# Patient Record
Sex: Male | Born: 1980 | Race: White | Hispanic: No | Marital: Married | Smoking: Never smoker
Health system: Southern US, Community
[De-identification: ages and names within clinical notes are randomized; demographics above are authoritative.]

## PROBLEM LIST (undated history)

## (undated) DIAGNOSIS — M549 Dorsalgia, unspecified: Secondary | ICD-10-CM

## (undated) DIAGNOSIS — Z8619 Personal history of other infectious and parasitic diseases: Secondary | ICD-10-CM

## (undated) DIAGNOSIS — F4310 Post-traumatic stress disorder, unspecified: Secondary | ICD-10-CM

## (undated) DIAGNOSIS — R200 Anesthesia of skin: Secondary | ICD-10-CM

## (undated) DIAGNOSIS — F419 Anxiety disorder, unspecified: Secondary | ICD-10-CM

## (undated) DIAGNOSIS — G8929 Other chronic pain: Secondary | ICD-10-CM

## (undated) DIAGNOSIS — K219 Gastro-esophageal reflux disease without esophagitis: Secondary | ICD-10-CM

## (undated) DIAGNOSIS — R202 Paresthesia of skin: Secondary | ICD-10-CM

## (undated) HISTORY — PX: NO PAST SURGERIES: SHX2092

---

## 2007-10-03 ENCOUNTER — Emergency Department (HOSPITAL_COMMUNITY): Admission: EM | Admit: 2007-10-03 | Discharge: 2007-10-03 | Payer: Self-pay | Admitting: Emergency Medicine

## 2010-01-07 ENCOUNTER — Emergency Department (HOSPITAL_COMMUNITY): Admission: EM | Admit: 2010-01-07 | Discharge: 2010-01-07 | Payer: Self-pay | Admitting: Emergency Medicine

## 2011-04-06 ENCOUNTER — Encounter: Payer: Self-pay | Admitting: Emergency Medicine

## 2011-04-06 ENCOUNTER — Emergency Department (HOSPITAL_COMMUNITY)
Admission: EM | Admit: 2011-04-06 | Discharge: 2011-04-07 | Disposition: A | Discharge status: Home / Self Care | Payer: 59 | Attending: Emergency Medicine | Admitting: Emergency Medicine

## 2011-04-06 DIAGNOSIS — R197 Diarrhea, unspecified: Secondary | ICD-10-CM | POA: Insufficient documentation

## 2011-04-06 DIAGNOSIS — R1110 Vomiting, unspecified: Secondary | ICD-10-CM

## 2011-04-06 DIAGNOSIS — R109 Unspecified abdominal pain: Secondary | ICD-10-CM | POA: Insufficient documentation

## 2011-04-06 DIAGNOSIS — R112 Nausea with vomiting, unspecified: Secondary | ICD-10-CM | POA: Insufficient documentation

## 2011-04-06 MED ORDER — SODIUM CHLORIDE 0.9 % IV BOLUS (SEPSIS)
1000.0000 mL | Freq: Once | INTRAVENOUS | Status: AC
  Administered 2011-04-06: 1000 mL via INTRAVENOUS

## 2011-04-06 MED ORDER — ONDANSETRON HCL 4 MG/2ML IJ SOLN
4.0000 mg | Freq: Once | INTRAMUSCULAR | Status: AC
  Administered 2011-04-06: 4 mg via INTRAVENOUS
  Filled 2011-04-06: qty 2

## 2011-04-06 NOTE — ED Notes (Signed)
C/o n/v/d since 5am. Also some generalized abd pain. States passing out in bathroom at urgent care in [place] then told to come to ED. Pt a&ox3, neuro intact. Denies hitting head.

## 2011-04-06 NOTE — ED Notes (Signed)
Pt also st's he had syncopal episode earlier today

## 2011-04-06 NOTE — ED Provider Notes (Signed)
History     CSN: [id number] Arrival date & time: 04/06/2011 10:09 PM   First MD Initiated Contact with Patient 04/06/11 2242      No chief complaint on file.   (Consider location/radiation/quality/duration/timing/severity/associated sxs/prior treatment) The history is provided by the patient.   Patient states that he woke up this morning with complaints of nausea, vomiting, diarrhea. He does have some abdominal pain but it is only right before retching. Assessment the patient has vomited he no longer feels any pain. He states that he is vomited  approximately 13-14 times day and states that he has diarrhea when his vomiting. He believes he is dehydrated, and just feels bad, and thinks he needs to be rehydrated. Other than that patient is healthy with no significant past medical history. He denies chest pain, shortness of breath, chills, fever. History reviewed. No pertinent past medical history.  History reviewed. No pertinent past surgical history.  No family history on file.  History  Substance Use Topics  . Smoking status: Never Smoker   . Smokeless tobacco: Never Used  . Alcohol Use: No      Review of Systems  All other systems reviewed and are negative.    Allergies  Review of patient's allergies indicates no known allergies.  Home Medications   Current Outpatient Rx  Name Route Sig Dispense Refill  . AMPHETAMINE-DEXTROAMPHETAMINE 30 MG PO CP24 Oral Take 30 mg by mouth every morning.        BP 114/65  Pulse 100  Temp(Src) 98.8 F (37.1 C) (Oral)  Resp 19  SpO2 100%  Physical Exam  Nursing note and vitals reviewed. Constitutional: He is oriented to person, place, and time. He appears well-developed and well-nourished.  HENT:  Head: Normocephalic and atraumatic.  Eyes: EOM are normal. Pupils are equal, round, and reactive to light.  Neck: Normal range of motion.  Cardiovascular: Normal rate and regular rhythm.   Pulmonary/Chest: Effort normal and breath  sounds normal.  Abdominal: Soft. Bowel sounds are normal.  Musculoskeletal: Normal range of motion.  Neurological: He is alert and oriented to person, place, and time.  Skin: Skin is warm and dry.    ED Course  Procedures (including critical care time)  Labs Reviewed - No data to display No results found.   No diagnosis found.    MDM  PT receiving 2L bolus of fluids and some Zofran. Currently not experiencing any pains.        Dorthula Matas, PA 04/06/11 2336

## 2011-04-06 NOTE — ED Notes (Signed)
Pt c/o nausea, vomiting and diarrhea, onset this am.  Has vomited approx 13-14 times since this am.  Unable to keep any liquids down

## 2011-04-07 LAB — POCT I-STAT, CHEM 8
BUN: 16 mg/dL (ref 6–23)
Calcium, Ion: 1.1 mmol/L — ABNORMAL LOW (ref 1.12–1.32)
Chloride: 104 mEq/L (ref 96–112)
Hemoglobin: 14.6 g/dL (ref 13.0–17.0)
Potassium: 3.7 mEq/L (ref 3.5–5.1)

## 2011-04-07 MED ORDER — PROMETHAZINE HCL 25 MG PO TABS: 25.0000 mg | ORAL_TABLET | Freq: Four times a day (QID) | ORAL | Status: AC | PRN

## 2011-04-07 MED ORDER — ONDANSETRON HCL 4 MG/2ML IJ SOLN
4.0000 mg | Freq: Once | INTRAMUSCULAR | Status: AC
  Administered 2011-04-07: 4 mg via INTRAVENOUS
  Filled 2011-04-07: qty 2

## 2011-04-07 NOTE — ED Provider Notes (Signed)
Medical screening examination/treatment/procedure(s) were performed by non-physician practitioner and as supervising physician I was immediately available for consultation/collaboration.   Lorien Shingler L Danaija Eskridge, MD 04/07/11 1935 

## 2011-04-07 NOTE — ED Notes (Signed)
Patient tolerating fluids. No complaints of nausea at this time

## 2011-04-07 NOTE — ED Provider Notes (Addendum)
History     CSN: [id number] Arrival date & time: 04/06/2011 10:09 PM   First MD Initiated Contact with Patient 04/06/11 2242      No chief complaint on file.   (Consider location/radiation/quality/duration/timing/severity/associated sxs/prior treatment) The history is provided by the patient.    History reviewed. No pertinent past medical history.  History reviewed. No pertinent past surgical history.  No family history on file.  History  Substance Use Topics  . Smoking status: Never Smoker   . Smokeless tobacco: Never Used  . Alcohol Use: No      Review of Systems  Constitutional: Negative.   HENT: Negative.   Respiratory: Negative.   Cardiovascular: Negative.   Gastrointestinal: Positive for nausea, vomiting and diarrhea.  Genitourinary: Negative for dysuria.  Musculoskeletal: Negative.   Neurological: Negative.   Hematological: Negative.   Psychiatric/Behavioral: Negative.     Allergies  Review of patient's allergies indicates no known allergies.  Home Medications   Current Outpatient Rx  Name Route Sig Dispense Refill  . AMPHETAMINE-DEXTROAMPHETAMINE 30 MG PO CP24 Oral Take 30 mg by mouth every morning.      Marland Kitchen PROMETHAZINE HCL 25 MG PO TABS Oral Take 1 tablet (25 mg total) by mouth every 6 (six) hours as needed for nausea. 20 tablet 0  . PROMETHAZINE HCL 25 MG PO TABS Oral Take 1 tablet (25 mg total) by mouth every 6 (six) hours as needed for nausea. 20 tablet 0    BP 92/51  Pulse 60  Temp(Src) 98.4 F (36.9 C) (Oral)  Resp 14  SpO2 98%  Physical Exam  Constitutional: He is oriented to person, place, and time. He appears well-developed and well-nourished.  Neck: Neck supple.  Cardiovascular:       hypotensive  Pulmonary/Chest: Breath sounds normal.  Abdominal: Soft.  Musculoskeletal: Normal range of motion.  Neurological: He is oriented to person, place, and time.  Skin: Skin is warm.  Psychiatric: He has a normal mood and affect.    ED  Course  Procedures (including critical care time)  Labs Reviewed  POCT I-STAT, CHEM 8 - Abnormal; Notable for the following:    Glucose, Bld 109 (*)    Calcium, Ion 1.10 (*)    All other components within normal limits  I-STAT, CHEM 8   No results found.   1. Vomiting and diarrhea    3:22 AM feels better able to tolerate PO  After IV fluids and tolerating PIO discharged   MDM  Viral vomiting        Arman Filter, NP 04/07/11 0322  Arman Filter, NP 04/07/11 8295  Arman Filter, NP 04/11/11 0518  Arman Filter, NP 04/11/11 6213  Arman Filter, NP 04/29/11 [phone]

## 2011-04-11 NOTE — ED Provider Notes (Signed)
Medical screening examination/treatment/procedure(s) were performed by non-physician practitioner and as supervising physician I was immediately available for consultation/collaboration.   Hershell Brandl D Lequita Meadowcroft, MD 04/11/11 0544 

## 2011-04-29 NOTE — ED Provider Notes (Signed)
Medical screening examination/treatment/procedure(s) were performed by non-physician practitioner and as supervising physician I was immediately available for consultation/collaboration.   Vida Roller, MD 04/29/11 [phone]

## 2011-10-28 ENCOUNTER — Encounter (HOSPITAL_COMMUNITY): Payer: Self-pay | Admitting: Emergency Medicine

## 2011-10-28 ENCOUNTER — Emergency Department (HOSPITAL_COMMUNITY): Payer: Worker's Compensation

## 2011-10-28 ENCOUNTER — Emergency Department (HOSPITAL_COMMUNITY)
Admission: EM | Admit: 2011-10-28 | Discharge: 2011-10-28 | Disposition: A | Discharge status: Home / Self Care | Payer: Worker's Compensation | Attending: Emergency Medicine | Admitting: Emergency Medicine

## 2011-10-28 DIAGNOSIS — M7580 Other shoulder lesions, unspecified shoulder: Secondary | ICD-10-CM

## 2011-10-28 DIAGNOSIS — Y990 Civilian activity done for income or pay: Secondary | ICD-10-CM | POA: Insufficient documentation

## 2011-10-28 DIAGNOSIS — M67919 Unspecified disorder of synovium and tendon, unspecified shoulder: Secondary | ICD-10-CM | POA: Insufficient documentation

## 2011-10-28 DIAGNOSIS — R296 Repeated falls: Secondary | ICD-10-CM | POA: Insufficient documentation

## 2011-10-28 DIAGNOSIS — M719 Bursopathy, unspecified: Secondary | ICD-10-CM | POA: Insufficient documentation

## 2011-10-28 DIAGNOSIS — R209 Unspecified disturbances of skin sensation: Secondary | ICD-10-CM | POA: Insufficient documentation

## 2011-10-28 DIAGNOSIS — M25519 Pain in unspecified shoulder: Secondary | ICD-10-CM | POA: Insufficient documentation

## 2011-10-28 MED ORDER — HYDROCODONE-ACETAMINOPHEN 5-325 MG PO TABS: 1.0000 | ORAL_TABLET | ORAL | Status: AC | PRN

## 2011-10-28 NOTE — Discharge Instructions (Signed)
Rotator Cuff Tendonitis  Please follow up with Dr. Victorino Dike, call his office tomorrow for an appointment.  Do not use the arm until you can be seen by him.  Use the sling for comfort, ice and take the pain medication as directed.  Do not drive while you are taking the pain medication as it will make you sleepy.   The rotator cuff is the collection of all the muscles and tendons (the supraspinatus, infraspinatus, subscapularis, and teres minor muscles and their tendons) that help your shoulder stay in place. This unit holds the head of the upper arm bone (humerus) in the cup (fossa) of the shoulder blade (scapula). Basically, it connects the arm to the shoulder. Tendinitis is a swelling and irritation of the tissue, called cord like structures (tendons) that connect muscle to bone. It usually is caused by overusing the joint involved. When the tissue surrounding a tendon (the synovium) becomes inflamed, it is called tenosynovitis. This also is often the result of overuse in people whose jobs require repetitive (over and over again) types of motion. HOME CARE INSTRUCTIONS   Use a sling or splint for as long as directed by your caregiver until the pain decreases.   Apply ice to the injury for 15 to 20 minutes, 3 to 4 times per day. Put the ice in a plastic bag and place a towel between the bag of ice and your skin.   Try to avoid use other than gentle range of motion while your shoulder is painful. Use and exercise only as directed by your caregiver. Stop exercises or range of motion if pain or discomfort increases, unless directed otherwise by your caregiver.   Only take over-the-counter or prescription medicines for pain, discomfort, or fever as directed by your caregiver.   If you were give a shoulder sling and straps (immobilizer), do not remove it except as directed, or until you see a caregiver for a follow-up examination. If you need to remove it, move your arm as little as possible or as directed.     You may want to sleep on several pillows at night to lessen swelling and pain.  SEEK IMMEDIATE MEDICAL CARE IF:   Pain in your shoulder increases or new pain develops in your arm, hand, or fingers and is not relieved with medications.   You develop new, unexplained symptoms, especially increased numbness in the hands or loss of strength, or you develop any worsening of the problems which brought you in for care.   Your arm, hand, or fingers are numb or tingling.   Your arm, hand, or fingers are swollen, painful, or turn white or blue.  Document Released: 07/25/2003 Document Revised: 04/23/2011 Document Reviewed: 03/01/2008 [facility] Patient Information 2012 [place], [place].
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
## 2011-10-28 NOTE — Progress Notes (Signed)
Orthopedic Tech Progress Note Patient Details:  Shawn Mcdonald 04/22/1981 [id number]  Ortho Devices Type of Ortho Device: Arm foam sling Ortho Device/Splint Location: (L) UE Ortho Device/Splint Interventions: Application   Jennye Moccasin 10/28/2011, 10:01 PM

## 2011-10-31 NOTE — ED Provider Notes (Signed)
Medical screening examination/treatment/procedure(s) were performed by non-physician practitioner and as supervising physician I was immediately available for consultation/collaboration.    Nelia Shi, MD 10/31/11 [phone]

## 2012-04-12 ENCOUNTER — Encounter (HOSPITAL_COMMUNITY): Payer: Self-pay | Admitting: Emergency Medicine

## 2012-04-12 ENCOUNTER — Emergency Department (HOSPITAL_COMMUNITY): Payer: Worker's Compensation

## 2012-04-12 ENCOUNTER — Emergency Department (HOSPITAL_COMMUNITY)
Admission: EM | Admit: 2012-04-12 | Discharge: 2012-04-12 | Disposition: A | Discharge status: Home / Self Care | Payer: Worker's Compensation | Attending: Emergency Medicine | Admitting: Emergency Medicine

## 2012-04-12 DIAGNOSIS — IMO0002 Reserved for concepts with insufficient information to code with codable children: Secondary | ICD-10-CM | POA: Insufficient documentation

## 2012-04-12 DIAGNOSIS — Y9389 Activity, other specified: Secondary | ICD-10-CM | POA: Insufficient documentation

## 2012-04-12 DIAGNOSIS — M549 Dorsalgia, unspecified: Secondary | ICD-10-CM | POA: Insufficient documentation

## 2012-04-12 DIAGNOSIS — Y9289 Other specified places as the place of occurrence of the external cause: Secondary | ICD-10-CM | POA: Insufficient documentation

## 2012-04-12 DIAGNOSIS — M545 Low back pain: Secondary | ICD-10-CM

## 2012-04-12 MED ORDER — HYDROCODONE-ACETAMINOPHEN 5-325 MG PO TABS: 1.0000 | ORAL_TABLET | Freq: Four times a day (QID) | ORAL | Status: DC | PRN

## 2012-04-12 MED ORDER — CYCLOBENZAPRINE HCL 10 MG PO TABS: 5.0000 mg | ORAL_TABLET | Freq: Three times a day (TID) | ORAL | Status: DC

## 2012-04-12 MED ORDER — IBUPROFEN 400 MG PO TABS
800.0000 mg | ORAL_TABLET | Freq: Once | ORAL | Status: AC
  Administered 2012-04-12: 800 mg via ORAL
  Filled 2012-04-12: qty 2

## 2012-04-12 MED ORDER — IBUPROFEN 800 MG PO TABS: 800.0000 mg | ORAL_TABLET | Freq: Three times a day (TID) | ORAL | Status: DC | PRN

## 2012-04-12 NOTE — ED Provider Notes (Signed)
Medical screening examination/treatment/procedure(s) were performed by non-physician practitioner and as supervising physician I was immediately available for consultation/collaboration.   Richardean Canal, MD 04/12/12 [phone]
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
## 2012-04-12 NOTE — ED Notes (Signed)
Pt c/o 3/10 sharp pain to lower back radiating to right thigh. Pt A&Ox4, ambulatory, nad.

## 2012-10-17 ENCOUNTER — Encounter (HOSPITAL_COMMUNITY): Payer: Self-pay | Admitting: Emergency Medicine

## 2012-10-17 ENCOUNTER — Emergency Department (HOSPITAL_COMMUNITY)
Admission: EM | Admit: 2012-10-17 | Discharge: 2012-10-18 | Disposition: A | Discharge status: Home / Self Care | Payer: Worker's Compensation | Attending: Emergency Medicine | Admitting: Emergency Medicine

## 2012-10-17 ENCOUNTER — Emergency Department (HOSPITAL_COMMUNITY): Payer: Worker's Compensation

## 2012-10-17 DIAGNOSIS — S43402A Unspecified sprain of left shoulder joint, initial encounter: Secondary | ICD-10-CM

## 2012-10-17 DIAGNOSIS — IMO0002 Reserved for concepts with insufficient information to code with codable children: Secondary | ICD-10-CM | POA: Insufficient documentation

## 2012-10-17 DIAGNOSIS — Y9389 Activity, other specified: Secondary | ICD-10-CM | POA: Insufficient documentation

## 2012-10-17 DIAGNOSIS — Z79899 Other long term (current) drug therapy: Secondary | ICD-10-CM | POA: Insufficient documentation

## 2012-10-17 DIAGNOSIS — Y9289 Other specified places as the place of occurrence of the external cause: Secondary | ICD-10-CM | POA: Insufficient documentation
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
## 2012-10-17 NOTE — ED Notes (Signed)
[id number]<AV> Expected date:<BR> Expected time:<BR> Means of arrival:<BR> Comments:<BR> Hold for Nand

## 2012-10-18 MED ORDER — MELOXICAM 15 MG PO TABS: 15.0000 mg | ORAL_TABLET | Freq: Every day | ORAL | Status: DC

## 2012-10-18 NOTE — ED Provider Notes (Signed)
Medical screening examination/treatment/procedure(s) were performed by non-physician practitioner and as supervising physician I was immediately available for consultation/collaboration.  Tionna Gigante L Hilary Pundt, MD 10/18/12 1530 

## 2014-08-02 ENCOUNTER — Other Ambulatory Visit: Payer: Self-pay | Admitting: Neurosurgery

## 2014-08-06 NOTE — Pre-Procedure Instructions (Signed)
Shawn Mcdonald  08/06/2014   Your procedure is scheduled on: Tues, Mar 29 @ 12:00 PM  Report to [facility]  at 9:00 AM.  Call this number if you have problems the morning of surgery: [phone]   Remember:   Do not eat food or drink liquids after midnight.   Take these medicines the morning of surgery with A SIP OF WATER: Adderall(Amphetamine-Dextoamphetamine) and Prozac(Fluoxetine)             Stop taking your Mobic. No Goody's,BC's,Aleve,Aspirin,Ibuprofen,Fish Oil,or any Herbal Medications.    Do not wear jewelry.  Do not wear lotions, powders, or colognes. You may wear deodorant.  Men may shave face and neck.  Do not bring valuables to the hospital.  [facility] is not responsible                  for any belongings or valuables.               Contacts, dentures or bridgework may not be worn into surgery.  Leave suitcase in the car. After surgery it may be brought to your room.  For patients admitted to the hospital, discharge time is determined by your                treatment team.                 Special Instructions:  [place] - Preparing for Surgery  Before surgery, you can play an important role.  Because skin is not sterile, your skin needs to be as free of germs as possible.  You can reduce the number of germs on you skin by washing with CHG (chlorahexidine gluconate) soap before surgery.  CHG is an antiseptic cleaner which kills germs and bonds with the skin to continue killing germs even after washing.  Please DO NOT use if you have an allergy to CHG or antibacterial soaps.  If your skin becomes reddened/irritated stop using the CHG and inform your nurse when you arrive at Short Stay.  Do not shave (including legs and underarms) for at least 48 hours prior to the first CHG shower.  You may shave your face.  Please follow these instructions carefully:   1.  Shower with CHG Soap the night before surgery and the                                morning of  Surgery.  2.  If you choose to wash your hair, wash your hair first as usual with your       normal shampoo.  3.  After you shampoo, rinse your hair and body thoroughly to remove the                      Shampoo.  4.  Use CHG as you would any other liquid soap.  You can apply chg directly       to the skin and wash gently with scrungie or a clean washcloth.  5.  Apply the CHG Soap to your body ONLY FROM THE NECK DOWN.        Do not use on open wounds or open sores.  Avoid contact with your eyes,       ears, mouth and genitals (private parts).  Wash genitals (private parts)       with your normal soap.  6.  Wash thoroughly,  paying special attention to the area where your surgery        will be performed.  7.  Thoroughly rinse your body with warm water from the neck down.  8.  DO NOT shower/wash with your normal soap after using and rinsing off       the CHG Soap.  9.  Pat yourself dry with a clean towel.            10.  Wear clean pajamas.            11.  Place clean sheets on your bed the night of your first shower and do not        sleep with pets.  Day of Surgery  Do not apply any lotions/deoderants the morning of surgery.  Please wear clean clothes to the hospital/surgery center.     Please read over the following fact sheets that you were given: Pain Booklet, Coughing and Deep Breathing, Blood Transfusion Information, MRSA Information and Surgical Site Infection Prevention

## 2014-08-07 ENCOUNTER — Encounter (HOSPITAL_COMMUNITY)
Admission: RE | Admit: 2014-08-07 | Discharge: 2014-08-07 | Disposition: A | Discharge status: Home / Self Care | Payer: 59 | Source: Ambulatory Visit | Attending: Neurosurgery | Admitting: Neurosurgery

## 2014-08-07 ENCOUNTER — Encounter (HOSPITAL_COMMUNITY): Payer: Self-pay

## 2014-08-07 DIAGNOSIS — M4316 Spondylolisthesis, lumbar region: Secondary | ICD-10-CM | POA: Insufficient documentation

## 2014-08-07 DIAGNOSIS — Z01812 Encounter for preprocedural laboratory examination: Secondary | ICD-10-CM | POA: Insufficient documentation

## 2014-08-07 HISTORY — DX: Anesthesia of skin: R20.0

## 2014-08-07 HISTORY — DX: Dorsalgia, unspecified: M54.9

## 2014-08-07 HISTORY — DX: Paresthesia of skin: R20.2

## 2014-08-07 HISTORY — DX: Post-traumatic stress disorder, unspecified: F43.10

## 2014-08-07 HISTORY — DX: Personal history of other infectious and parasitic diseases: Z86.19

## 2014-08-07 HISTORY — DX: Anxiety disorder, unspecified: F41.9

## 2014-08-07 HISTORY — DX: Other chronic pain: G89.29

## 2014-08-07 HISTORY — DX: Gastro-esophageal reflux disease without esophagitis: K21.9

## 2014-08-07 LAB — BASIC METABOLIC PANEL
ANION GAP: 8 (ref 5–15)
BUN: 10 mg/dL (ref 6–23)
CALCIUM: 9.5 mg/dL (ref 8.4–10.5)
CO2: 29 mmol/L (ref 19–32)
CREATININE: 0.87 mg/dL (ref 0.50–1.35)
Chloride: 103 mmol/L (ref 96–112)
GFR calc non Af Amer: >90 mL/min (ref >90)
Glucose, Bld: 91 mg/dL (ref 70–99)
Potassium: 3.9 mmol/L (ref 3.5–5.1)
SODIUM: 140 mmol/L (ref 135–145)

## 2014-08-07 LAB — TYPE AND SCREEN
ABO/RH(D): O POS
Antibody Screen: NEGATIVE

## 2014-08-07 LAB — ABO/RH: ABO/RH(D): O POS

## 2014-08-07 LAB — SURGICAL PCR SCREEN
MRSA, PCR: NEGATIVE
STAPHYLOCOCCUS AUREUS: POSITIVE — AB

## 2014-08-07 NOTE — Progress Notes (Addendum)
Pt doesn't have a cardiologist  Stress test done 5+ yrs and was done d/t passed out x1(done at [facility]) was told everything looked good  Denies ever having a heart cath  Dr.Holt [facility] [place]  Denies EKG or CXR in past yr

## 2014-08-07 NOTE — Progress Notes (Signed)
Called Mupirocin script into the [facility]

## 2014-08-13 MED ORDER — CEFAZOLIN SODIUM-DEXTROSE 2-3 GM-% IV SOLR
2.0000 g | INTRAVENOUS | Status: AC
  Administered 2014-08-14 (×2): 2 g via INTRAVENOUS
  Filled 2014-08-13: qty 50

## 2014-08-13 NOTE — Anesthesia Preprocedure Evaluation (Addendum)
Anesthesia Evaluation  Patient identified by MRN, date of birth, ID band Patient awake    Reviewed: Allergy & Precautions, NPO status , Patient's Chart, lab work & pertinent test results  Airway Mallampati: II   Neck ROM: Full    Dental  (+) Teeth Intact, Implants   Pulmonary neg pulmonary ROS,  breath sounds clear to auscultation        Cardiovascular negative cardio ROS  Rhythm:Regular     Neuro/Psych Anxiety    GI/Hepatic Neg liver ROS, GERD-  Medicated,  Endo/Other  negative endocrine ROS  Renal/GU negative Renal ROS     Musculoskeletal   Abdominal (+) + obese,   Peds  Hematology   Anesthesia Other Findings Upper front implant  Reproductive/Obstetrics                            Anesthesia Physical Anesthesia Plan  ASA: II  Anesthesia Plan: General   Post-op Pain Management:    Induction: Intravenous  Airway Management Planned: Oral ETT  Additional Equipment:   Intra-op Plan:   Post-operative Plan: Extubation in OR  Informed Consent: I have reviewed the patients History and Physical, chart, labs and discussed the procedure including the risks, benefits and alternatives for the proposed anesthesia with the patient or authorized representative who has indicated his/her understanding and acceptance.     Plan Discussed with:   Anesthesia Plan Comments: (Multimodal pain rx)        Anesthesia Quick Evaluation

## 2014-08-13 NOTE — H&P (Signed)
Shawn Mcdonald is an 34 y.o. male.   Chief Complaint: leg pain while walking HPI: patient complaining of lumbar pain with radiation to both legs worsened by walking. He also has noticed weakness and tingling sensation in both legs. Had a lumbar mri  Past Medical History  Diagnosis Date  . Numbness and tingling     weakness and related to back issues  . Chronic back pain     spondylolisthesis/scoliosis  . GERD (gastroesophageal reflux disease)     hx of-many yrs ago  . Anxiety     takes Prozac daily  . PTSD (post-traumatic stress disorder)     takes Adderall daily  . History of shingles     Past Surgical History  Procedure Laterality Date  . No past surgeries      No family history on file. Social History:  reports that he has never smoked. His smokeless tobacco use includes Chew. He reports that he drinks alcohol. He reports that he does not use illicit drugs.  Allergies: No Known Allergies  No prescriptions prior to admission    No results found for this or any previous visit (from the past 48 hour(s)). No results found.  Review of Systems  Constitutional: Negative.   HENT: Negative.   Eyes: Negative.   Respiratory: Negative.   Cardiovascular: Negative.   Gastrointestinal: Negative.   Genitourinary: Negative.   Musculoskeletal: Positive for back pain.  Skin: Negative.   Neurological: Positive for focal weakness.  Endo/Heme/Allergies: Negative.   Psychiatric/Behavioral: Negative.     There were no vitals taken for this visit. Physical Exam  Hent,nl. Neck, nl. Cv, nl. Lungs,clear. Abdomen, soft. Extremities, nl. NEURO  SLR  Positive at40 in the right and 15 in the left. rifgh foot 2/5 of df and left 3/5. Sensory normal but tingling in both feet. Mri  Shows a grade 2 spondylolisthesis with hnp Assessment/Plan Patient wants to go ahead with surgery which will be a l5s1 fusion with cages and pedicle screws. He is aware of benefits and risks such as no improvement,  infection, csf lead, worsening of the weakness, not fusion, damage to the nerves , need of further surgery  Evora Schechter M 08/13/2014, 4:16 PM

## 2014-08-14 ENCOUNTER — Inpatient Hospital Stay (HOSPITAL_COMMUNITY): Payer: 59

## 2014-08-14 ENCOUNTER — Inpatient Hospital Stay (HOSPITAL_COMMUNITY)
Admission: RE | Admit: 2014-08-14 | Discharge: 2014-08-18 | DRG: 460 | Disposition: A | Discharge status: Home / Self Care | Payer: 59 | Source: Ambulatory Visit | Attending: Neurosurgery | Admitting: Neurosurgery

## 2014-08-14 ENCOUNTER — Inpatient Hospital Stay (HOSPITAL_COMMUNITY): Payer: 59 | Admitting: Anesthesiology

## 2014-08-14 ENCOUNTER — Encounter (HOSPITAL_COMMUNITY)
Admission: RE | Disposition: A | Discharge status: Home / Self Care | Payer: 59 | Source: Ambulatory Visit | Attending: Neurosurgery

## 2014-08-14 ENCOUNTER — Encounter (HOSPITAL_COMMUNITY): Payer: Self-pay | Admitting: *Deleted

## 2014-08-14 DIAGNOSIS — M4316 Spondylolisthesis, lumbar region: Secondary | ICD-10-CM | POA: Diagnosis present

## 2014-08-14 DIAGNOSIS — M419 Scoliosis, unspecified: Secondary | ICD-10-CM | POA: Diagnosis present

## 2014-08-14 DIAGNOSIS — M5416 Radiculopathy, lumbar region: Secondary | ICD-10-CM | POA: Diagnosis present

## 2014-08-14 DIAGNOSIS — M4317 Spondylolisthesis, lumbosacral region: Secondary | ICD-10-CM | POA: Diagnosis present

## 2014-08-14 DIAGNOSIS — K219 Gastro-esophageal reflux disease without esophagitis: Secondary | ICD-10-CM | POA: Diagnosis present

## 2014-08-14 DIAGNOSIS — F17220 Nicotine dependence, chewing tobacco, uncomplicated: Secondary | ICD-10-CM | POA: Diagnosis present

## 2014-08-14 DIAGNOSIS — F419 Anxiety disorder, unspecified: Secondary | ICD-10-CM | POA: Diagnosis present

## 2014-08-14 DIAGNOSIS — Z419 Encounter for procedure for purposes other than remedying health state, unspecified: Secondary | ICD-10-CM

## 2014-08-14 DIAGNOSIS — F4310 Post-traumatic stress disorder, unspecified: Secondary | ICD-10-CM | POA: Diagnosis present

## 2014-08-14 DIAGNOSIS — M79606 Pain in leg, unspecified: Secondary | ICD-10-CM | POA: Diagnosis present

## 2014-08-14 LAB — CBC
HEMATOCRIT: 45.3 % (ref 39.0–52.0)
HEMOGLOBIN: 15.4 g/dL (ref 13.0–17.0)
MCH: 29.6 pg (ref 26.0–34.0)
MCHC: 34 g/dL (ref 30.0–36.0)
MCV: 86.9 fL (ref 78.0–100.0)
Platelets: 220 10*3/uL (ref 150–400)
RBC: 5.21 MIL/uL (ref 4.22–5.81)
RDW: 12.5 % (ref 11.5–15.5)
WBC: 8.4 10*3/uL (ref 4.0–10.5)

## 2014-08-14 SURGERY — POSTERIOR LUMBAR FUSION 1 LEVEL
Anesthesia: General

## 2014-08-14 MED ORDER — VANCOMYCIN HCL 1000 MG IV SOLR
INTRAVENOUS | Status: AC
  Filled 2014-08-14: qty 1000

## 2014-08-14 MED ORDER — MENTHOL 3 MG MT LOZG
1.0000 | LOZENGE | OROMUCOSAL | Status: DC | PRN
Start: 2014-08-14 — End: 2014-08-18
  Filled 2014-08-14: qty 9

## 2014-08-14 MED ORDER — MEPERIDINE HCL 25 MG/ML IJ SOLN
6.2500 mg | INTRAMUSCULAR | Status: DC | PRN
Start: 2014-08-14 — End: 2014-08-14

## 2014-08-14 MED ORDER — ROCURONIUM BROMIDE 50 MG/5ML IV SOLN
INTRAVENOUS | Status: AC
  Filled 2014-08-14: qty 1

## 2014-08-14 MED ORDER — FLUOXETINE HCL 10 MG PO CAPS
10.0000 mg | ORAL_CAPSULE | Freq: Every day | ORAL | Status: DC
  Administered 2014-08-15 – 2014-08-18 (×4): 10 mg via ORAL
  Filled 2014-08-14 (×4): qty 1

## 2014-08-14 MED ORDER — FENTANYL CITRATE 0.05 MG/ML IJ SOLN
INTRAMUSCULAR | Status: AC
  Filled 2014-08-14: qty 5

## 2014-08-14 MED ORDER — ROCURONIUM BROMIDE 50 MG/5ML IV SOLN
INTRAVENOUS | Status: AC
  Filled 2014-08-14: qty 3

## 2014-08-14 MED ORDER — LACTATED RINGERS IV SOLN
Freq: Once | INTRAVENOUS | Status: AC
  Administered 2014-08-14: 09:00:00 via INTRAVENOUS

## 2014-08-14 MED ORDER — SODIUM CHLORIDE 0.9 % IV SOLN
INTRAVENOUS | Status: DC | PRN
Start: 2014-08-14 — End: 2014-08-14
  Administered 2014-08-14: 15:00:00 via INTRAVENOUS

## 2014-08-14 MED ORDER — PROPOFOL 10 MG/ML IV BOLUS
INTRAVENOUS | Status: AC
  Filled 2014-08-14: qty 20

## 2014-08-14 MED ORDER — ROCURONIUM BROMIDE 100 MG/10ML IV SOLN
INTRAVENOUS | Status: DC | PRN
  Administered 2014-08-14 (×3): 10 mg via INTRAVENOUS
  Administered 2014-08-14: 50 mg via INTRAVENOUS
  Administered 2014-08-14 (×2): 10 mg via INTRAVENOUS

## 2014-08-14 MED ORDER — LIDOCAINE HCL (CARDIAC) 20 MG/ML IV SOLN
INTRAVENOUS | Status: DC | PRN
  Administered 2014-08-14: 50 mg via INTRAVENOUS

## 2014-08-14 MED ORDER — MIDAZOLAM HCL 2 MG/2ML IJ SOLN
INTRAMUSCULAR | Status: AC
  Filled 2014-08-14: qty 2

## 2014-08-14 MED ORDER — LACTATED RINGERS IV SOLN
INTRAVENOUS | Status: DC | PRN
  Administered 2014-08-14 (×3): via INTRAVENOUS

## 2014-08-14 MED ORDER — BUPIVACAINE LIPOSOME 1.3 % IJ SUSP
INTRAMUSCULAR | Status: DC | PRN
  Administered 2014-08-14: 20 mL

## 2014-08-14 MED ORDER — PROPOFOL 10 MG/ML IV BOLUS
INTRAVENOUS | Status: DC | PRN
  Administered 2014-08-14: 200 mg via INTRAVENOUS

## 2014-08-14 MED ORDER — MORPHINE SULFATE (PF) 1 MG/ML IV SOLN
INTRAVENOUS | Status: AC
  Filled 2014-08-14: qty 25

## 2014-08-14 MED ORDER — MORPHINE SULFATE (PF) 1 MG/ML IV SOLN
INTRAVENOUS | Status: DC
  Administered 2014-08-14: 1 mg via INTRAVENOUS
  Administered 2014-08-14 – 2014-08-15 (×2): via INTRAVENOUS
  Administered 2014-08-15: 34.23 mg via INTRAVENOUS
  Filled 2014-08-14 (×4): qty 25

## 2014-08-14 MED ORDER — SODIUM CHLORIDE 0.9 % IJ SOLN
3.0000 mL | Freq: Two times a day (BID) | INTRAMUSCULAR | Status: DC
Start: 2014-08-14 — End: 2014-08-18
  Administered 2014-08-14 – 2014-08-16 (×3): 3 mL via INTRAVENOUS

## 2014-08-14 MED ORDER — ZOLPIDEM TARTRATE 5 MG PO TABS: 5.0000 mg | ORAL_TABLET | Freq: Every evening | ORAL | Status: DC | PRN

## 2014-08-14 MED ORDER — ONDANSETRON HCL 4 MG/2ML IJ SOLN
4.0000 mg | INTRAMUSCULAR | Status: DC | PRN
Start: 2014-08-14 — End: 2014-08-14

## 2014-08-14 MED ORDER — SODIUM CHLORIDE 0.9 % IJ SOLN: 3.0000 mL | INTRAMUSCULAR | Status: DC | PRN

## 2014-08-14 MED ORDER — MIDAZOLAM HCL 5 MG/5ML IJ SOLN
INTRAMUSCULAR | Status: DC | PRN
  Administered 2014-08-14: 2 mg via INTRAVENOUS

## 2014-08-14 MED ORDER — FENTANYL CITRATE 0.05 MG/ML IJ SOLN
INTRAMUSCULAR | Status: DC | PRN
  Administered 2014-08-14: 100 ug via INTRAVENOUS
  Administered 2014-08-14 (×2): 50 ug via INTRAVENOUS
  Administered 2014-08-14: 150 ug via INTRAVENOUS
  Administered 2014-08-14 (×2): 50 ug via INTRAVENOUS
  Administered 2014-08-14 (×2): 100 ug via INTRAVENOUS

## 2014-08-14 MED ORDER — LIDOCAINE HCL (CARDIAC) 20 MG/ML IV SOLN
INTRAVENOUS | Status: AC
  Filled 2014-08-14: qty 10

## 2014-08-14 MED ORDER — PHENYLEPHRINE 40 MCG/ML (10ML) SYRINGE FOR IV PUSH (FOR BLOOD PRESSURE SUPPORT)
PREFILLED_SYRINGE | INTRAVENOUS | Status: AC
  Filled 2014-08-14: qty 10

## 2014-08-14 MED ORDER — OXYCODONE-ACETAMINOPHEN 5-325 MG PO TABS
1.0000 | ORAL_TABLET | ORAL | Status: DC | PRN
  Administered 2014-08-14 – 2014-08-15 (×5): 2 via ORAL
  Administered 2014-08-15: 1 via ORAL
  Administered 2014-08-15 – 2014-08-16 (×3): 2 via ORAL
  Filled 2014-08-14 (×3): qty 2
  Filled 2014-08-14: qty 1
  Filled 2014-08-14 (×5): qty 2

## 2014-08-14 MED ORDER — GLYCOPYRROLATE 0.2 MG/ML IJ SOLN
INTRAMUSCULAR | Status: AC
  Filled 2014-08-14: qty 4

## 2014-08-14 MED ORDER — ONDANSETRON HCL 4 MG/2ML IJ SOLN
INTRAMUSCULAR | Status: DC | PRN
  Administered 2014-08-14: 4 mg via INTRAVENOUS

## 2014-08-14 MED ORDER — PHENYLEPHRINE HCL 10 MG/ML IJ SOLN
INTRAMUSCULAR | Status: AC
  Filled 2014-08-14: qty 1

## 2014-08-14 MED ORDER — SODIUM CHLORIDE 0.9 % IV SOLN: 250.0000 mL | INTRAVENOUS | Status: DC

## 2014-08-14 MED ORDER — ACETAMINOPHEN 650 MG RE SUPP: 650.0000 mg | RECTAL | Status: DC | PRN

## 2014-08-14 MED ORDER — FENTANYL CITRATE 0.05 MG/ML IJ SOLN
25.0000 ug | INTRAMUSCULAR | Status: DC | PRN
  Administered 2014-08-14 (×2): 50 ug via INTRAVENOUS

## 2014-08-14 MED ORDER — EPHEDRINE SULFATE 50 MG/ML IJ SOLN
INTRAMUSCULAR | Status: AC
  Filled 2014-08-14: qty 1

## 2014-08-14 MED ORDER — ONDANSETRON HCL 4 MG/2ML IJ SOLN
INTRAMUSCULAR | Status: AC
  Filled 2014-08-14: qty 2

## 2014-08-14 MED ORDER — GLYCOPYRROLATE 0.2 MG/ML IJ SOLN
INTRAMUSCULAR | Status: DC | PRN
  Administered 2014-08-14: .7 mg via INTRAVENOUS

## 2014-08-14 MED ORDER — SURGIFOAM 100 EX MISC
CUTANEOUS | Status: DC | PRN
  Administered 2014-08-14 (×2): via TOPICAL

## 2014-08-14 MED ORDER — ONDANSETRON HCL 4 MG/2ML IJ SOLN
4.0000 mg | Freq: Four times a day (QID) | INTRAMUSCULAR | Status: DC | PRN
  Administered 2014-08-15: 4 mg via INTRAVENOUS
  Filled 2014-08-14: qty 2

## 2014-08-14 MED ORDER — NEOSTIGMINE METHYLSULFATE 10 MG/10ML IV SOLN
INTRAVENOUS | Status: AC
  Filled 2014-08-14: qty 1

## 2014-08-14 MED ORDER — BUPIVACAINE LIPOSOME 1.3 % IJ SUSP
20.0000 mL | Freq: Once | INTRAMUSCULAR | Status: DC
  Filled 2014-08-14: qty 20

## 2014-08-14 MED ORDER — SUCCINYLCHOLINE CHLORIDE 20 MG/ML IJ SOLN
INTRAMUSCULAR | Status: AC
  Filled 2014-08-14: qty 1

## 2014-08-14 MED ORDER — CEFAZOLIN SODIUM 1-5 GM-% IV SOLN
1.0000 g | Freq: Three times a day (TID) | INTRAVENOUS | Status: AC
  Administered 2014-08-14 – 2014-08-15 (×2): 1 g via INTRAVENOUS
  Filled 2014-08-14 (×2): qty 50

## 2014-08-14 MED ORDER — PHENOL 1.4 % MT LIQD
1.0000 | OROMUCOSAL | Status: DC | PRN
  Filled 2014-08-14: qty 177

## 2014-08-14 MED ORDER — OXYCODONE-ACETAMINOPHEN 5-325 MG PO TABS
ORAL_TABLET | ORAL | Status: AC
Start: 2014-08-14 — End: 2014-08-15
  Filled 2014-08-14: qty 2

## 2014-08-14 MED ORDER — ACETAMINOPHEN 325 MG PO TABS
650.0000 mg | ORAL_TABLET | ORAL | Status: DC | PRN
  Administered 2014-08-17: 650 mg via ORAL
  Filled 2014-08-14: qty 2

## 2014-08-14 MED ORDER — 0.9 % SODIUM CHLORIDE (POUR BTL) OPTIME
TOPICAL | Status: DC | PRN
  Administered 2014-08-14: 1000 mL

## 2014-08-14 MED ORDER — PROMETHAZINE HCL 25 MG/ML IJ SOLN
INTRAMUSCULAR | Status: AC
  Filled 2014-08-14: qty 1

## 2014-08-14 MED ORDER — NALOXONE HCL 0.4 MG/ML IJ SOLN: 0.4000 mg | INTRAMUSCULAR | Status: DC | PRN

## 2014-08-14 MED ORDER — DIPHENHYDRAMINE HCL 50 MG/ML IJ SOLN: 12.5000 mg | Freq: Four times a day (QID) | INTRAMUSCULAR | Status: DC | PRN

## 2014-08-14 MED ORDER — SODIUM CHLORIDE 0.9 % IJ SOLN: 9.0000 mL | INTRAMUSCULAR | Status: DC | PRN

## 2014-08-14 MED ORDER — SODIUM CHLORIDE 0.9 % IV SOLN
INTRAVENOUS | Status: DC
  Administered 2014-08-14 (×2): via INTRAVENOUS

## 2014-08-14 MED ORDER — PROMETHAZINE HCL 25 MG/ML IJ SOLN
6.2500 mg | INTRAMUSCULAR | Status: DC | PRN
  Administered 2014-08-14: 6.25 mg via INTRAVENOUS

## 2014-08-14 MED ORDER — THROMBIN 5000 UNITS EX SOLR
OROMUCOSAL | Status: DC | PRN
  Administered 2014-08-14: 12:00:00 via TOPICAL

## 2014-08-14 MED ORDER — STERILE WATER FOR INJECTION IJ SOLN
INTRAMUSCULAR | Status: AC
  Filled 2014-08-14: qty 10

## 2014-08-14 MED ORDER — GLYCOPYRROLATE 0.2 MG/ML IJ SOLN
INTRAMUSCULAR | Status: AC
  Filled 2014-08-14: qty 1

## 2014-08-14 MED ORDER — DIPHENHYDRAMINE HCL 12.5 MG/5ML PO ELIX: 12.5000 mg | ORAL_SOLUTION | Freq: Four times a day (QID) | ORAL | Status: DC | PRN

## 2014-08-14 MED ORDER — DIAZEPAM 5 MG PO TABS
5.0000 mg | ORAL_TABLET | Freq: Four times a day (QID) | ORAL | Status: DC | PRN
  Administered 2014-08-14 – 2014-08-18 (×12): 5 mg via ORAL
  Filled 2014-08-14 (×13): qty 1

## 2014-08-14 MED ORDER — VANCOMYCIN HCL 500 MG IV SOLR
INTRAVENOUS | Status: DC | PRN
  Administered 2014-08-14 (×2): 1000 mg

## 2014-08-14 MED ORDER — DIAZEPAM 5 MG PO TABS
ORAL_TABLET | ORAL | Status: AC
  Filled 2014-08-14: qty 1

## 2014-08-14 MED ORDER — AMPHETAMINE-DEXTROAMPHET ER 10 MG PO CP24
30.0000 mg | ORAL_CAPSULE | Freq: Every day | ORAL | Status: DC
  Administered 2014-08-15 – 2014-08-18 (×4): 30 mg via ORAL
  Filled 2014-08-14 (×4): qty 3

## 2014-08-14 MED ORDER — FENTANYL CITRATE 0.05 MG/ML IJ SOLN
INTRAMUSCULAR | Status: AC
  Filled 2014-08-14: qty 2

## 2014-08-14 MED ORDER — NEOSTIGMINE METHYLSULFATE 10 MG/10ML IV SOLN
INTRAVENOUS | Status: DC | PRN
  Administered 2014-08-14: 4 mg via INTRAVENOUS

## 2014-08-14 SURGICAL SUPPLY — 73 items
BENZOIN TINCTURE PRP APPL 2/3 (GAUZE/BANDAGES/DRESSINGS) ×3 IMPLANT
BLADE CLIPPER SURG (BLADE) IMPLANT
BUR ACORN 6.0 (BURR) ×2 IMPLANT
BUR ACORN 6.0MM (BURR) ×1
BUR MATCHSTICK NEURO 3.0 LAGG (BURR) ×3 IMPLANT
CANISTER SUCT 3000ML PPV (MISCELLANEOUS) ×3 IMPLANT
CAP LOCKING THREADED (Cap) ×12 IMPLANT
CLOSURE WOUND 1/2 X4 (GAUZE/BANDAGES/DRESSINGS) ×1
CONT SPEC 4OZ CLIKSEAL STRL BL (MISCELLANEOUS) ×3 IMPLANT
COVER BACK TABLE 60X90IN (DRAPES) ×3 IMPLANT
CROSSLINK SPINAL 38-50MM (Neuro Prosthesis/Implant) ×3 IMPLANT
DRAPE C-ARM 42X72 X-RAY (DRAPES) ×6 IMPLANT
DRAPE LAPAROTOMY 100X72X124 (DRAPES) ×3 IMPLANT
DRAPE POUCH INSTRU U-SHP 10X18 (DRAPES) ×3 IMPLANT
DRAPE PROXIMA HALF (DRAPES) ×9 IMPLANT
DRSG OPSITE POSTOP 4X6 (GAUZE/BANDAGES/DRESSINGS) ×3 IMPLANT
DRSG PAD ABDOMINAL 8X10 ST (GAUZE/BANDAGES/DRESSINGS) IMPLANT
DURAPREP 26ML APPLICATOR (WOUND CARE) ×3 IMPLANT
ELECT BLADE 4.0 EZ CLEAN MEGAD (MISCELLANEOUS) ×3
ELECT REM PT RETURN 9FT ADLT (ELECTROSURGICAL) ×3
ELECTRODE BLDE 4.0 EZ CLN MEGD (MISCELLANEOUS) ×1 IMPLANT
ELECTRODE REM PT RTRN 9FT ADLT (ELECTROSURGICAL) ×1 IMPLANT
EVACUATOR 1/8 PVC DRAIN (DRAIN) IMPLANT
EVACUATOR 3/16  PVC DRAIN (DRAIN) ×2
EVACUATOR 3/16 PVC DRAIN (DRAIN) ×1 IMPLANT
GAUZE SPONGE 4X4 12PLY STRL (GAUZE/BANDAGES/DRESSINGS) ×3 IMPLANT
GAUZE SPONGE 4X4 16PLY XRAY LF (GAUZE/BANDAGES/DRESSINGS) ×3 IMPLANT
GLOVE BIOGEL M 8.0 STRL (GLOVE) ×3 IMPLANT
GLOVE ECLIPSE 7.5 STRL STRAW (GLOVE) ×9 IMPLANT
GLOVE ECLIPSE 8.0 STRL XLNG CF (GLOVE) ×3 IMPLANT
GLOVE EXAM NITRILE LRG STRL (GLOVE) IMPLANT
GLOVE EXAM NITRILE MD LF STRL (GLOVE) IMPLANT
GLOVE EXAM NITRILE XL STR (GLOVE) IMPLANT
GLOVE EXAM NITRILE XS STR PU (GLOVE) IMPLANT
GLOVE INDICATOR 7.5 STRL GRN (GLOVE) ×12 IMPLANT
GOWN STRL REUS W/ TWL LRG LVL3 (GOWN DISPOSABLE) ×3 IMPLANT
GOWN STRL REUS W/ TWL XL LVL3 (GOWN DISPOSABLE) ×2 IMPLANT
GOWN STRL REUS W/TWL 2XL LVL3 (GOWN DISPOSABLE) IMPLANT
GOWN STRL REUS W/TWL LRG LVL3 (GOWN DISPOSABLE) ×6
GOWN STRL REUS W/TWL XL LVL3 (GOWN DISPOSABLE) ×4
HEMOSTAT POWDER SURGIFOAM 1G (HEMOSTASIS) ×3 IMPLANT
KIT BASIN OR (CUSTOM PROCEDURE TRAY) ×3 IMPLANT
KIT INFUSE MEDIUM (Orthopedic Implant) ×3 IMPLANT
KIT ROOM TURNOVER OR (KITS) ×3 IMPLANT
MILL MEDIUM DISP (BLADE) ×3 IMPLANT
NEEDLE HYPO 18GX1.5 BLUNT FILL (NEEDLE) IMPLANT
NEEDLE HYPO 21X1.5 SAFETY (NEEDLE) ×3 IMPLANT
NEEDLE HYPO 25X1 1.5 SAFETY (NEEDLE) ×3 IMPLANT
NS IRRIG 1000ML POUR BTL (IV SOLUTION) ×3 IMPLANT
PACK LAMINECTOMY NEURO (CUSTOM PROCEDURE TRAY) ×3 IMPLANT
PAD ARMBOARD 7.5X6 YLW CONV (MISCELLANEOUS) ×9 IMPLANT
PATTIES SURGICAL .5 X1 (DISPOSABLE) ×3 IMPLANT
PATTIES SURGICAL .5 X3 (DISPOSABLE) IMPLANT
PATTIES SURGICAL 1X1 (DISPOSABLE) ×6 IMPLANT
ROD CREO 45MM SPINAL (Rod) ×6 IMPLANT
SCREW CREO THREADED 5.5X45MM (Screw) ×12 IMPLANT
SPACER RISE 8X22 11-17MM-15 (Spacer) ×6 IMPLANT
SPONGE LAP 4X18 X RAY DECT (DISPOSABLE) IMPLANT
SPONGE NEURO XRAY DETECT 1X3 (DISPOSABLE) IMPLANT
SPONGE SURGIFOAM ABS GEL 100 (HEMOSTASIS) ×6 IMPLANT
STRIP CLOSURE SKIN 1/2X4 (GAUZE/BANDAGES/DRESSINGS) ×2 IMPLANT
SUT VIC AB 1 CT1 18XBRD ANBCTR (SUTURE) ×2 IMPLANT
SUT VIC AB 1 CT1 8-18 (SUTURE) ×4
SUT VIC AB 2-0 CP2 18 (SUTURE) ×3 IMPLANT
SUT VIC AB 3-0 SH 8-18 (SUTURE) ×3 IMPLANT
SYR 20CC LL (SYRINGE) ×3 IMPLANT
SYR 20ML ECCENTRIC (SYRINGE) ×3 IMPLANT
SYR 5ML LL (SYRINGE) IMPLANT
TOWEL OR 17X24 6PK STRL BLUE (TOWEL DISPOSABLE) ×3 IMPLANT
TOWEL OR 17X26 10 PK STRL BLUE (TOWEL DISPOSABLE) ×3 IMPLANT
TRAY FOLEY CATH 14FRSI W/METER (CATHETERS) IMPLANT
TRAY FOLEY CATH 16FRSI W/METER (SET/KITS/TRAYS/PACK) ×3 IMPLANT
WATER STERILE IRR 1000ML POUR (IV SOLUTION) ×3 IMPLANT

## 2014-08-14 NOTE — Transfer of Care (Signed)
Immediate Anesthesia Transfer of Care Note  Patient: Shawn BoastJoshua Mcdonald  Procedure(s) Performed: Procedure(s): LUMBAR FIVE-SACRAL ONE  DECOMPRESSION AND POSTERIOR LUMBAR INTERBODY FUSION  (N/A)  Patient Location: PACU  Anesthesia Type:General  Level of Consciousness: awake, alert  and oriented  Airway & Oxygen Therapy: Patient Spontanous Breathing and Patient connected to nasal cannula oxygen  Post-op Assessment: Report given to RN and Post -op Vital signs reviewed and stable  Post vital signs: Reviewed and stable  Last Vitals:  Filed Vitals:   08/14/14 1715  BP:   Pulse:   Temp: 36.9 C  Resp:     Complications: No apparent anesthesia complications

## 2014-08-14 NOTE — Anesthesia Postprocedure Evaluation (Signed)
Anesthesia Post Note  Patient: Shawn Mcdonald  Procedure(s) Performed: Procedure(s) (LRB): LUMBAR FIVE-SACRAL ONE  DECOMPRESSION AND POSTERIOR LUMBAR INTERBODY FUSION  (N/A)  Anesthesia type: General  Patient location: PACU  Post pain: Pain level controlled  Post assessment: Post-op Vital signs reviewed  Last Vitals: BP 116/79 mmHg  Pulse 69  Temp(Src) 36.7 C (Oral)  Resp 12  Ht 5\' 10"  (1.778 m)  Wt 239 lb (108.41 kg)  BMI 34.29 kg/m2  SpO2 96%  Post vital signs: Reviewed  Level of consciousness: sedated  Complications: No apparent anesthesia complications

## 2014-08-14 NOTE — Anesthesia Procedure Notes (Signed)
Procedure Name: Intubation Date/Time: 08/14/2014 12:45 PM Performed by: Eligha Bridegroom Pre-anesthesia Checklist: Patient identified, Timeout performed, Emergency Drugs available, Suction available and Patient being monitored Patient Re-evaluated:Patient Re-evaluated prior to inductionOxygen Delivery Method: Circle system utilized Preoxygenation: Pre-oxygenation with 100% oxygen Intubation Type: IV induction Ventilation: Mask ventilation without difficulty and Oral airway inserted - appropriate to patient size Grade View: Grade II Tube type: Oral Tube size: 8.0 mm Number of attempts: 1 Airway Equipment and Method: Stylet and LTA kit utilized Placement Confirmation: ETT inserted through vocal cords under direct vision,  breath sounds checked- equal and bilateral and positive ETCO2 Secured at: 21 cm Dental Injury: Teeth and Oropharynx as per pre-operative assessment

## 2014-08-14 NOTE — OR Nursing (Signed)
Aspen lumbar fusion brace ordered from [employer]

## 2014-08-15 LAB — GLUCOSE, CAPILLARY: Glucose-Capillary: 114 mg/dL — ABNORMAL HIGH (ref 70–99)

## 2014-08-15 MED ORDER — ONDANSETRON HCL 4 MG/2ML IJ SOLN: 4.0000 mg | Freq: Four times a day (QID) | INTRAMUSCULAR | Status: DC | PRN

## 2014-08-15 MED ORDER — ONDANSETRON HCL 4 MG/2ML IJ SOLN
INTRAMUSCULAR | Status: AC
Start: 2014-08-15 — End: 2014-08-15
  Administered 2014-08-15: 14:00:00
  Filled 2014-08-15: qty 2

## 2014-08-15 MED FILL — Sodium Chloride Irrigation Soln 0.9%: Qty: 3000 | Status: AC

## 2014-08-15 MED FILL — Sodium Chloride IV Soln 0.9%: INTRAVENOUS | Qty: 1000 | Status: AC

## 2014-08-15 MED FILL — Heparin Sodium (Porcine) Inj 1000 Unit/ML: INTRAMUSCULAR | Qty: 30 | Status: AC

## 2014-08-15 NOTE — Progress Notes (Signed)
Patient ID: Shawn BoastJoshua Mcdonald, male   DOB: [birth year], 34 y.o.   MRN: [id number] Off pca morphine. Incisional pain . No leg pain as preop. hemovac still draining

## 2014-08-15 NOTE — Progress Notes (Signed)
UR complete.  Reubin Bushnell RN, MSN 

## 2014-08-15 NOTE — Progress Notes (Signed)
Orthopedic Tech Progress Note Patient Details:  Shawn Mcdonald 03-02-1981 [id number]  Patient ID: Shawn Mcdonald, male   DOB: 03-02-1981, 34 y.o.   MRN: [id number] RN stated that vendor brought aspen lumbar brace to pt this morning. This apparently was done without ortho calling the vendor.  Nikki DomCrawford, Reilly Blades 08/15/2014, 10:57 AM

## 2014-08-15 NOTE — Evaluation (Signed)
Occupational Therapy Evaluation Patient Details Name: Shawn Mcdonald MRN: [id number] DOB: 11/09/1980 Today's Date: 08/15/2014    History of Present Illness s/p LUMBAR FIVE-SACRAL ONE DECOMPRESSION AND POSTERIOR LUMBAR INTERBODY FUSION    Clinical Impression   Patient independent PTA. Patient currently functioning at an overall supervision level. Patient will benefit from acute OT to increase overall independence in the areas of ADLs, functional mobility, overall safety in order to safely discharge home with wife.     Follow Up Recommendations  No OT follow up;Supervision - Intermittent    Equipment Recommendations  3 in 1 bedside comode    Recommendations for Other Services  None at this time     Precautions / Restrictions Precautions Precautions: Back;Fall Precaution Comments: Reviewed back precautions with patient Required Braces or Orthoses: Spinal Brace Spinal Brace: Lumbar corset;Applied in sitting position Restrictions Weight Bearing Restrictions: No      Mobility Bed Mobility Overal bed mobility: Needs Assistance Bed Mobility: Rolling;Sidelying to Sit Rolling: Supervision Sidelying to sit: Supervision       General bed mobility comments: cues for log rolling technique and patient used bed rails for trunk assist  Transfers Overall transfer level: Needs assistance Equipment used: None Transfers: Sit to/from Stand Sit to Stand: Supervision         General transfer comment: Cues for proper sit<>stand technique. Cues for back precations     Balance Overall balance assessment: No apparent balance deficits (not formally assessed)    ADL Overall ADL's : Needs assistance/impaired Eating/Feeding: Set up;Sitting   Grooming: Supervision/safety;Standing   Upper Body Bathing: Set up;Sitting   Lower Body Bathing: Supervison/ safety;Sit to/from stand;Cueing for safety;Adhering to back precautions   Upper Body Dressing : Set up;Sitting   Lower Body  Dressing: Supervision/safety;Sit to/from stand;Cueing for safety;Adhering to back precautions   Toilet Transfer: Supervision/safety;BSC;Ambulation   Functional mobility during ADLs: Supervision/safety General ADL Comments: Patient able to cross BLEs for LB ADLs. Educated patient on safe and effective sit<>stand transfer method. Encouraged patient to do as much for himself as he could. Plan to address functional transfers prior to patient discharging > home.     Pertinent Vitals/Pain Pain Assessment: 0-10 Pain Score: 6  Pain Location: back Pain Descriptors / Indicators: Throbbing;Stabbing Pain Intervention(s): Monitored during session;Repositioned     Hand Dominance Right   Extremity/Trunk Assessment Upper Extremity Assessment Upper Extremity Assessment: Overall WFL for tasks assessed   Lower Extremity Assessment Lower Extremity Assessment: Defer to PT evaluation   Cervical / Trunk Assessment Cervical / Trunk Assessment: Normal   Communication Communication Communication: No difficulties   Cognition Arousal/Alertness: Awake/alert Behavior During Therapy: WFL for tasks assessed/performed Overall Cognitive Status: Within Functional Limits for tasks assessed              Home Living Family/patient expects to be discharged to:: Private residence Living Arrangements: Spouse/significant other;Children (children are 2 and 4) Available Help at Discharge: Family;Available PRN/intermittently;Available 24 hours/day (wife works as a Runner, broadcasting/film/videoteacher, patient's states mom can assist post discharge) Type of Home: House Home Access: Stairs to enter [employer] Stairs-Number of Steps: 3 Entrance Stairs-Rails: Right Home Layout: Two level;Bed/bath upstairs Alternate Level Stairs-Number of Steps: 2 steps, landing, then flight Alternate Level Stairs-Rails: Left Bathroom Shower/Tub: Walk-in shower;Door   [employer] Toilet: Standard     Home Equipment: Shower seat - built in   Additional  Comments: low built in bench in shower      Prior Functioning/Environment Level of Independence: Independent     OT Diagnosis: Generalized weakness;Acute pain  OT Problem List: Decreased strength;Decreased activity tolerance;Decreased safety awareness;Decreased knowledge of use of DME or AE;Decreased knowledge of precautions;Pain   OT Treatment/Interventions: Self-care/ADL training;Energy conservation;DME and/or AE instruction;Therapeutic activities;Patient/family education    OT Goals(Current goals can be found in the care plan section) Acute Rehab OT Goals Patient Stated Goal: be pain free OT Goal Formulation: With patient/family Time For Goal Achievement: 08/22/14 Potential to Achieve Goals: Good ADL Goals Pt Will Perform Grooming: with modified independence;standing Pt Will Perform Lower Body Bathing: with modified independence;sit to/from stand Pt Will Perform Lower Body Dressing: with modified independence;sit to/from stand Pt Will Transfer to Toilet: with modified independence;ambulating;bedside commode Pt Will Perform Toileting - Clothing Manipulation and hygiene: with modified independence;sit to/from stand;with adaptive equipment Pt Will Perform Tub/Shower Transfer: Shower transfer;with modified independence;3 in 1;ambulating  OT Frequency: Min 2X/week   Barriers to D/C: None known at this time          End of Session Equipment Utilized During Treatment: Back brace  Activity Tolerance: Patient tolerated treatment well Patient left: in chair;with call bell/phone within reach;with family/visitor present   Time: 4696-2952 OT Time Calculation (min): 29 min Charges:  OT General Charges $OT Visit: 1 Procedure OT Evaluation $Initial OT Evaluation Tier I: 1 Procedure OT Treatments $Self Care/Home Management : 8-22 mins  Ardian Haberland , MS, OTR/L, CLT Pager: [phone]  08/15/2014, 10:56 AM

## 2014-08-15 NOTE — Evaluation (Signed)
Physical Therapy Evaluation Patient Details Name: Shawn BoastJoshua Fina MRN: [id number] DOB: [birth year] Today's Date: 08/15/2014   History of Present Illness  s/p LUMBAR FIVE-SACRAL ONE DECOMPRESSION AND POSTERIOR LUMBAR INTERBODY FUSION   Clinical Impression  Patient presents at supervision to mod independent level.  Have completed education for mobility with back precautions including activity progression and car transfers.  No further skilled PT needs at this time.  Patient encouraged to walk with assist in hall at least 2x/day till d/c.    Follow Up Recommendations No PT follow up    Equipment Recommendations  None recommended by PT    Recommendations for Other Services       Precautions / Restrictions Precautions Precautions: Back Precaution Comments: Reviewed back precautions with patient Required Braces or Orthoses: Spinal Brace Spinal Brace: Lumbar corset;Applied in sitting position Restrictions Weight Bearing Restrictions: No      Mobility  Bed Mobility Overal bed mobility: Needs Assistance Bed Mobility: Sit to Sidelying Rolling: Modified independent (Device/Increase time) Sidelying to sit: Supervision     Sit to sidelying: Supervision General bed mobility comments: cues for how to move from sit to sidelying; min cues how to sit up without bed rail  Transfers Overall transfer level: Modified independent Equipment used: None Transfers: Sit to/from Stand Sit to Stand: Modified independent (Device/Increase time)         General transfer comment: Cues for proper sit<>stand technique. Cues for back precations   Ambulation/Gait Ambulation/Gait assistance: Independent Ambulation Distance (Feet): 300 Feet Assistive device: None Gait Pattern/deviations: Step-through pattern;Decreased stride length        Stairs Stairs: Yes Stairs assistance: Supervision Stair Management: Step to pattern;Forwards;One rail Right Number of Stairs: 3 General stair comments:  cues for sequence, technique for back precautions; limited number due to IV  Wheelchair Mobility    Modified Rankin (Stroke Patients Only)       Balance Overall balance assessment: No apparent balance deficits (not formally assessed)                                           Pertinent Vitals/Pain Pain Assessment: 0-10 Pain Score: 5  Pain Location: back, left post thigh Pain Descriptors / Indicators: Throbbing;Stabbing Pain Intervention(s): Monitored during session    Home Living Family/patient expects to be discharged to:: Private residence Living Arrangements: Spouse/significant other;Children Available Help at Discharge: Family;Available PRN/intermittently;Available 24 hours/day Type of Home: House Home Access: Stairs to enter Entrance Stairs-Rails: Right Entrance Stairs-Number of Steps: 3 Home Layout: Two level;Bed/bath upstairs Home Equipment: Shower seat - built in Additional Comments: low built in bench in shower    Prior Function Level of Independence: Independent               Hand Dominance   Dominant Hand: Right    Extremity/Trunk Assessment   Upper Extremity Assessment: Overall WFL for tasks assessed           Lower Extremity Assessment: Overall WFL for tasks assessed (painful with left knee extension testing)      Cervical / Trunk Assessment: Normal  Communication   Communication: No difficulties  Cognition Arousal/Alertness: Awake/alert Behavior During Therapy: WFL for tasks assessed/performed Overall Cognitive Status: Within Functional Limits for tasks assessed                      General Comments      Exercises Other Exercises  Other Exercises: educated in car transfers with back precautions and progression of activity level      Assessment/Plan    PT Assessment Patent does not need any further PT services  PT Diagnosis Acute pain   PT Problem List    PT Treatment Interventions     PT Goals  (Current goals can be found in the Care Plan section) Acute Rehab PT Goals Patient Stated Goal: be pain free PT Goal Formulation: All assessment and education complete, DC therapy    Frequency     Barriers to discharge        Co-evaluation               End of Session Equipment Utilized During Treatment: Back brace Activity Tolerance: Patient tolerated treatment well Patient left: in chair           Time: 1218-1239 PT Time Calculation (min) (ACUTE ONLY): 21 min   Charges:   PT Evaluation $Initial PT Evaluation Tier I: 1 Procedure PT Treatments $Self Care/Home Management: 8-22   PT G Codes:        Ammon Muscatello,CYNDI 08-24-2014, 1:17 PM  Sheran Lawless, PT [phone] 08-24-14

## 2014-08-16 MED ORDER — BISACODYL 10 MG RE SUPP
10.0000 mg | Freq: Every day | RECTAL | Status: DC | PRN
  Administered 2014-08-17: 10 mg via RECTAL
  Filled 2014-08-16: qty 1

## 2014-08-16 MED ORDER — OXYCODONE HCL 5 MG PO TABS
20.0000 mg | ORAL_TABLET | ORAL | Status: DC | PRN
  Administered 2014-08-16 – 2014-08-18 (×11): 20 mg via ORAL
  Filled 2014-08-16 (×12): qty 4

## 2014-08-16 NOTE — Op Note (Signed)
Shawn Mcdonald, Shawn Mcdonald              ACCOUNT NO.:  [id number]  MEDICAL RECORD NO.:  [id number]  LOCATION:                                 FACILITY:  PHYSICIAN:  Hilda Lias, M.D.   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  POSTOPERATIVE DIAGNOSIS:  L5-S1 spondylolisthesis with acute and chronic radiculopathy; herniated disk, L5-S1.  PROCEDURE:  Bilateral L5 laminectomy and facetectomy, bilateral L5-S1 diskectomy more than normal to be able to introduce 2 cages.  Bilateral partial L4 laminotomy for lysis of adhesion to decompress both L5 nerve roots.  Pedicle screws at L5-S1, posterolateral arthrodesis with autograft and BMP Cell Saver, C-arm.  SURGEON:  Hilda Lias, M.D.  ASSISTANT:  Reinaldo Meeker, M.D.  CLINICAL HISTORY:  The patient is a 34 year old gentleman complaining of back pain worsened to both legs with weakness associated with a tingling sensation.  He is getting worse.  He had difficulty walking.  Feeling improved pain.  MRI plus x-ray showed that he has a grade 1 and grade 2 spondylolisthesis at the level of L5-S1 with a bilateral herniated disk. Surgery was advised.  He and his wife knew the risk and benefit with the surgery.  DESCRIPTION OF PROCEDURE:  The patient was taken to the OR, and after intubation, he was positioned in a prone manner.  The back was cleaned with Betadine and later on with DuraPrep.  Drapes were applied.  Because of the size, it was difficult to feel any bony structure.  A midline incision was made through the skin and subcutaneous tissue.  The muscle and fascia were retracted laterally.  Immediately, we found that the L5 posterior arch was absolutely loose.  X-rays showed indeed we were right at the L5-S1.  Immediately what we  did is to remove the spinous process of L5, the lamina, as well as both facet.  This came easily. Immediately once it was removed, we found quite a bit of scar tissue surrounding both the L5-S1 nerve root, worse at the L5 than the S1. Lysis was accomplished.  We found that there was eschar tissue going north from the axilla only but also from the shoulder of the L5 nerve root and we did bilateral L4 laminotomy to be able to decompress the L5 nerve root from above.  Having done this, we entered the disk space first on the right side and then on the left side and total gross diskectomy more than normal was done.  The endplates were removed. Then, 2 cages expandable were introduced.  The cages had BMP as well as autograft.  The cages were expanded all the way up to 15 mm.  There was also 15 degrees of lordosis.  Then, the rest of the disk space was filled up with a mix of BMP and autograft.  Then, using the C-arm first in AP view and then in lateral view, we were able to make 4 holes at the level of the L5 and S1 pedicles.  We felt the holes to  be sure and we were surrounded by bone.  From then on, four screws of 6.5 x 45 were inserted, which were connected with a rod and caps.  Cross-link from right to left was done.  From then on, we went laterally.  We removed the periosteal of the L5-S1 lateral aspect of the facet and a mix of BMP and autograft were used for arthrodesis.  The area was irrigated.  Hemovac was left in the operative site and vancomycin powder was left also in the same area.  From then on, the wound was closed with Vicryl and Steri-Strips.  The patient did well, and he went to the recovery room.          ______________________________ Hilda LiasErnesto Shaia Mcdonald, M.D.     EB/MEDQ  D:  08/14/2014  T:  08/15/2014  Job:  [id number]

## 2014-08-16 NOTE — Progress Notes (Signed)
Patient ID: Shawn Mcdonald, male   DOB: Apr 11, 1981, 34 y.o.   MRN: [id number] C/o incisional pain, no weakness. Decrease of drainage. See orders

## 2014-08-17 MED ORDER — BISACODYL 10 MG RE SUPP: 10.0000 mg | Freq: Every day | RECTAL | Status: DC | PRN

## 2014-08-17 NOTE — Progress Notes (Signed)
Occupational Therapy Treatment Patient Details Name: Shawn Mcdonald MRN: [id number] DOB: 01-03-1981 Today's Date: 08/17/2014    History of present illness s/p LUMBAR FIVE-SACRAL ONE DECOMPRESSION AND POSTERIOR LUMBAR INTERBODY FUSION    OT comments  Pt. Able to complete UB/LB ADLS including b.room and shower stall transfers at S level.  Clear for d/c from OT standpoint.  Reports mother or wife will be with him 24/7.  Will alert OTR/L to sign off.   Follow Up Recommendations  No OT follow up;Supervision - Intermittent    Equipment Recommendations  3 in 1 bedside comode    Recommendations for Other Services      Precautions / Restrictions Precautions Precautions: Back Precaution Comments: Reviewed back precautions with patient Required Braces or Orthoses: Spinal Brace Spinal Brace: Lumbar corset;Applied in sitting position       Mobility Bed Mobility Overal bed mobility: Modified Independent Bed Mobility: Rolling;Sidelying to Sit;Sit to Supine Rolling: Modified independent (Device/Increase time) Sidelying to sit: Modified independent (Device/Increase time)   Sit to supine: Modified independent (Device/Increase time) Sit to sidelying: Modified independent (Device/Increase time)    Transfers Overall transfer level: Modified independent Equipment used: None   Sit to Stand: Modified independent (Device/Increase time)         General transfer comment: Cues for proper sit<>stand technique. Cues for back precations     Balance                                   ADL Overall ADL's : Needs assistance/impaired Eating/Feeding: Set up;Sitting   Grooming: Supervision/safety;Standing           Upper Body Dressing : Set up;Sitting     Lower Body Dressing Details (indicate cue type and reason): declined demo but states he performs LB dressing by crossing L/R legs over knees while seated then standing to pull up  Toilet Transfer:  Supervision/safety;Ambulation;Comfort height toilet   Toileting- Clothing Manipulation and Hygiene: Supervision/safety;Sit to/from stand   Tub/ Shower Transfer: Supervision/safety;Walk-in shower;Ambulation;Shower seat   Functional mobility during ADLs: Supervision/safety General ADL Comments: pt. return demo of toileting and shower stall transfer ambulating with no DME.        Vision                     Perception     Praxis      Cognition   Behavior During Therapy: WFL for tasks assessed/performed Overall Cognitive Status: Within Functional Limits for tasks assessed                       Extremity/Trunk Assessment               Exercises     Shoulder Instructions       General Comments      Pertinent Vitals/ Pain       Pain Assessment: 0-10 Pain Score: 5  Pain Location: back Pain Descriptors / Indicators: Aching Pain Intervention(s): Premedicated before session;Monitored during session;Repositioned  Home Living                                          Prior Functioning/Environment              Frequency Min 2X/week     Progress Toward Goals  OT Goals(current goals can now  be found in the care plan section)  Progress towards OT goals: Goals met/education completed, patient discharged from [facility] Discharge plan remains appropriate    Co-evaluation                 End of Session Equipment Utilized During Treatment: Back brace   Activity Tolerance Patient tolerated treatment well   Patient Left in bed;with call bell/phone within reach   Nurse Communication Mobility status;Other (comment) (notified he is ready for d/c from OT standpoint)        Time: 0601-5615 OT Time Calculation (min): 10 min  Charges: OT General Charges $OT Visit: 1 Procedure OT Treatments $Self Care/Home Management : 8-22 mins  Janice Coffin, COTA/L 08/17/2014, 8:50 AM

## 2014-08-17 NOTE — Progress Notes (Signed)
Loss of IV access. Pt refuses start of new IV at this time. Educated with teach back.  Gara KronerHayles, Aaro Meyers M, RN

## 2014-08-17 NOTE — Progress Notes (Signed)
Patient ID: Shawn Mcdonald, male   DOB: 05/26/1980, 34 y.o.   MRN: [id number] Better, ambulating. constipation

## 2014-08-18 MED ORDER — OXYCODONE HCL 20 MG PO TABS: 20.0000 mg | ORAL_TABLET | ORAL | Status: DC | PRN

## 2014-08-18 MED ORDER — DIAZEPAM 5 MG PO TABS: 5.0000 mg | ORAL_TABLET | Freq: Four times a day (QID) | ORAL | Status: DC | PRN

## 2014-08-18 NOTE — Discharge Summary (Signed)
  Physician Discharge Summary  Patient ID: Sim BoastJoshua Kennerson MRN: [id number] DOB/AGE: [birth year] 34 y.o.  Admit date: 08/14/2014 Discharge date: 08/18/2014  Admission Diagnoses: Spondylolisthesis, L5-S1  Discharge Diagnoses: Same Active Problems:   Spondylolisthesis of lumbar region   Discharged Condition: Stable  Hospital Course:  Mrs. Sim BoastJoshua Postlewait is a 34 y.o. male who was electively admitted after uncomplicated L5-S1 PLIF. He had an uncomplicated hospital course. On POD# 3, back pain was controlled with oral medication, he was ambulating without difficulty independently, voiding normally, and tolerating diet.  Treatments: Surgery - L5-S1 PLIF  Discharge Exam: Blood pressure 104/66, pulse 86, temperature 98.7 F (37.1 C), temperature source Oral, resp. rate 18, height 5\' 10"  (1.778 m), weight 108.41 kg (239 lb), SpO2 95 %. Awake, alert, oriented Speech fluent, appropriate CN grossly intact 5/5 BUE/BLE Wound c/d/i  Follow-up: Follow-up in Dr. Cassandria SanteeBotero's office [facility] [phone]) in 3 weeks  Disposition: 01-Home or Self Care     Medication List    TAKE these medications        amphetamine-dextroamphetamine 30 MG 24 hr capsule  Commonly known as:  ADDERALL XR  Take 30 mg by mouth daily.     diazepam 5 MG tablet  Commonly known as:  VALIUM  Take 1 tablet (5 mg total) by mouth every 6 (six) hours as needed for muscle spasms.     FLUoxetine 10 MG capsule  Commonly known as:  PROZAC  Take 10 mg by mouth daily.     meloxicam 15 MG tablet  Commonly known as:  MOBIC  Take 1 tablet (15 mg total) by mouth daily.     Oxycodone HCl 20 MG Tabs  Take 1 tablet (20 mg total) by mouth every 4 (four) hours as needed for severe pain.         SignedLisbeth Renshaw: Anayiah Howden, C 08/18/2014, 7:24 AM

## 2014-08-18 NOTE — Progress Notes (Signed)
Discharge orders received.  Discharge instructions and follow-up appointments reviewed with the patient.  VSS upon discharge.  IV removed and education complete.  Prescriptions given to the patient.  Transported out via wheelchair.  Sondra ComeSilva, Anacristina Steffek M, RN

## 2014-08-18 NOTE — Progress Notes (Signed)
Discharge today to see me in 2 to 3 weeks

## 2014-08-22 ENCOUNTER — Emergency Department (HOSPITAL_COMMUNITY)
Admission: EM | Admit: 2014-08-22 | Discharge: 2014-08-23 | Disposition: A | Discharge status: Home / Self Care | Payer: 59 | Attending: Emergency Medicine | Admitting: Emergency Medicine

## 2014-08-22 ENCOUNTER — Encounter (HOSPITAL_COMMUNITY): Payer: Self-pay | Admitting: Emergency Medicine

## 2014-08-22 DIAGNOSIS — Y92013 Bedroom of single-family (private) house as the place of occurrence of the external cause: Secondary | ICD-10-CM | POA: Diagnosis not present

## 2014-08-22 DIAGNOSIS — W1839XA Other fall on same level, initial encounter: Secondary | ICD-10-CM | POA: Diagnosis not present

## 2014-08-22 DIAGNOSIS — Y998 Other external cause status: Secondary | ICD-10-CM | POA: Diagnosis not present

## 2014-08-22 DIAGNOSIS — W19XXXA Unspecified fall, initial encounter: Secondary | ICD-10-CM

## 2014-08-22 DIAGNOSIS — Z79899 Other long term (current) drug therapy: Secondary | ICD-10-CM | POA: Diagnosis not present

## 2014-08-22 DIAGNOSIS — Z8619 Personal history of other infectious and parasitic diseases: Secondary | ICD-10-CM | POA: Diagnosis not present

## 2014-08-22 DIAGNOSIS — Z8719 Personal history of other diseases of the digestive system: Secondary | ICD-10-CM | POA: Diagnosis not present

## 2014-08-22 DIAGNOSIS — T40601A Poisoning by unspecified narcotics, accidental (unintentional), initial encounter: Secondary | ICD-10-CM | POA: Insufficient documentation

## 2014-08-22 DIAGNOSIS — Y92009 Unspecified place in unspecified non-institutional (private) residence as the place of occurrence of the external cause: Secondary | ICD-10-CM

## 2014-08-22 DIAGNOSIS — G8929 Other chronic pain: Secondary | ICD-10-CM | POA: Insufficient documentation

## 2014-08-22 DIAGNOSIS — F419 Anxiety disorder, unspecified: Secondary | ICD-10-CM | POA: Diagnosis not present

## 2014-08-22 DIAGNOSIS — Z043 Encounter for examination and observation following other accident: Secondary | ICD-10-CM | POA: Insufficient documentation

## 2014-08-22 DIAGNOSIS — Y9389 Activity, other specified: Secondary | ICD-10-CM | POA: Diagnosis not present

## 2014-08-22 MED ORDER — NALOXONE HCL 0.4 MG/ML IJ SOLN
0.4000 mg | Freq: Once | INTRAMUSCULAR | Status: AC
  Administered 2014-08-22: 0.4 mg via INTRAVENOUS

## 2014-08-22 MED ORDER — NALOXONE HCL 0.4 MG/ML IJ SOLN
INTRAMUSCULAR | Status: AC
  Administered 2014-08-22: 0.4 mg via INTRAVENOUS
  Filled 2014-08-22: qty 1

## 2014-08-22 NOTE — ED Provider Notes (Addendum)
CSN: [id number]     Arrival date & time 08/22/14  2327 History   First MD Initiated Contact with Patient 08/22/14 2333     This chart was scribed for Shawn Booze, MD by Arlan Organ, ED Scribe. This patient was seen in room A07C/A07C and the patient's care was started 11:36 PM.   Chief Complaint  Patient presents with  . Fall   The history is provided by the patient. No language interpreter was used.   LEVEL 5 CAVEAT DUE TO CONDITION  HPI Comments: Shawn Mcdonald here with his wife brought in by EMS is a 34 y.o. male with a PMHx of GERD, anxiety, PTSD who presents to the Emergency Department here for altered mental status this evening. Per Wife, pt fell in his bedroom at approximately 9:00 PM this evening. After fall she states "he just was not with it and and not responsive". Prior to fall pt was last seen normal and playing play station. [name] was discharged from the hospital Saturday 4/2 after undergoing back surgery. Pt was prescribed Valium and Oxycodone and has een taking both medications every 4 hours in addition to every day medications- Adderall and Prozac. No known allergies to medications.  He is followed by Dr. Leonor Liv at [facility] in [place] Past Medical History  Diagnosis Date  . Numbness and tingling     weakness and related to back issues  . Chronic back pain     spondylolisthesis/scoliosis  . GERD (gastroesophageal reflux disease)     hx of-many yrs ago  . Anxiety     takes Prozac daily  . PTSD (post-traumatic stress disorder)     takes Adderall daily  . History of shingles    Past Surgical History  Procedure Laterality Date  . No past surgeries     No family history on file. History  Substance Use Topics  . Smoking status: Never Smoker   . Smokeless tobacco: Current User    Types: Chew  . Alcohol Use: Yes     Comment: rarely    Review of Systems  Unable to perform ROS: Other      Allergies  Review of patient's allergies indicates no known  allergies.  Home Medications   Prior to Admission medications   Medication Sig Start Date End Date Taking? Authorizing Provider  amphetamine-dextroamphetamine (ADDERALL XR) 30 MG 24 hr capsule Take 30 mg by mouth daily.     Historical Provider, MD  diazepam (VALIUM) 5 MG tablet Take 1 tablet (5 mg total) by mouth every 6 (six) hours as needed for muscle spasms. 08/18/14   Lisbeth Renshaw, MD  FLUoxetine (PROZAC) 10 MG capsule Take 10 mg by mouth daily.    Historical Provider, MD  meloxicam (MOBIC) 15 MG tablet Take 1 tablet (15 mg total) by mouth daily. Patient not taking: Reported on 08/06/2014 10/18/12   Jaynie Crumble, PA-C  oxyCODONE 20 MG TABS Take 1 tablet (20 mg total) by mouth every 4 (four) hours as needed for severe pain. 08/18/14   Lisbeth Renshaw, MD   Triage Vitals: BP 121/79 mmHg  Pulse 80  Temp(Src) 99 F (37.2 C) (Oral)  Resp 22  SpO2 94%    Physical Exam  Constitutional: He appears well-developed and well-nourished.  Sleeping Minimally arousable to voice and pain  HENT:  Head: Normocephalic and atraumatic.  Eyes: EOM are normal. Pupils are equal, round, and reactive to light.  Fundi normal  Neck: Normal range of motion. Neck supple. No JVD  present.  Cardiovascular: Normal rate, regular rhythm, normal heart sounds and intact distal pulses.   No murmur heard. Pulmonary/Chest: Effort normal and breath sounds normal. He has no wheezes. He has no rales. He exhibits no tenderness.  Abdominal: Soft. Bowel sounds are normal. He exhibits no distension and no mass. There is no tenderness.  Musculoskeletal: Normal range of motion. He exhibits no edema.  Lymphadenopathy:    He has no cervical adenopathy.  Neurological: No cranial nerve deficit. Coordination normal.  No babinski reflex 4 beat ankle clonus on R 2 beat ankle clonus on L  Skin: Skin is warm and dry. No rash noted.  Nursing note and vitals reviewed.   ED Course  Procedures (including critical care  time)  DIAGNOSTIC STUDIES: Oxygen Saturation is 94% on RA, adequate by my interpretation.    COORDINATION OF CARE: 11:36 PM-Discussed treatment plan with pt at bedside and pt agreed to plan.     Labs Review Results for orders placed or performed during the hospital encounter of 08/22/14  Acetaminophen level  Result Value Ref Range   Acetaminophen (Tylenol), Serum <10.0 (L) 10 - 30 ug/mL  Comprehensive metabolic panel  Result Value Ref Range   Sodium 139 135 - 145 mmol/L   Potassium 4.2 3.5 - 5.1 mmol/L   Chloride 99 96 - 112 mmol/L   CO2 34 (H) 19 - 32 mmol/L   Glucose, Bld 107 (H) 70 - 99 mg/dL   BUN 12 6 - 23 mg/dL   Creatinine, Ser 4.54 0.50 - 1.35 mg/dL   Calcium 9.3 8.4 - 09.8 mg/dL   Total Protein 6.6 6.0 - 8.3 g/dL   Albumin 3.4 (L) 3.5 - 5.2 g/dL   AST 29 0 - 37 U/L   ALT 35 0 - 53 U/L   Alkaline Phosphatase 64 39 - 117 U/L   Total Bilirubin 0.4 0.3 - 1.2 mg/dL   GFR calc non Af Amer >90 >90 mL/min   GFR calc Af Amer >90 >90 mL/min   Anion gap 6 5 - 15  Ethanol  Result Value Ref Range   Alcohol, Ethyl (B) <5 0 - 9 mg/dL  Troponin I  Result Value Ref Range   Troponin I <0.03 <0.031 ng/mL  Salicylate level  Result Value Ref Range   Salicylate Lvl <4.0 2.8 - 20.0 mg/dL  Urinalysis, Routine w reflex microscopic  Result Value Ref Range   Color, Urine YELLOW YELLOW   APPearance CLEAR CLEAR   Specific Gravity, Urine 1.021 1.005 - 1.030   pH 5.5 5.0 - 8.0   Glucose, UA NEGATIVE NEGATIVE mg/dL   Hgb urine dipstick NEGATIVE NEGATIVE   Bilirubin Urine NEGATIVE NEGATIVE   Ketones, ur NEGATIVE NEGATIVE mg/dL   Protein, ur NEGATIVE NEGATIVE mg/dL   Urobilinogen, UA 1.0 0.0 - 1.0 mg/dL   Nitrite NEGATIVE NEGATIVE   Leukocytes, UA NEGATIVE NEGATIVE  Urine rapid drug screen (hosp performed)  Result Value Ref Range   Opiates POSITIVE (A) NONE DETECTED   Cocaine NONE DETECTED NONE DETECTED   Benzodiazepines POSITIVE (A) NONE DETECTED   Amphetamines POSITIVE (A) NONE  DETECTED   Tetrahydrocannabinol NONE DETECTED NONE DETECTED   Barbiturates NONE DETECTED NONE DETECTED   Imaging Review Ct Head Wo Contrast  08/23/2014   CLINICAL DATA:  Fall at home with subsequent poor responsiveness.  EXAM: CT HEAD WITHOUT CONTRAST  CT CERVICAL SPINE WITHOUT CONTRAST  TECHNIQUE: Multidetector CT imaging of the head and cervical spine was performed following the standard protocol without  intravenous contrast. Multiplanar CT image reconstructions of the cervical spine were also generated.  COMPARISON:  None.  FINDINGS: CT HEAD FINDINGS  Skull and Sinuses:Negative for fracture or destructive process. The mastoids, middle ears, and imaged paranasal sinuses are clear.  Orbits: No acute abnormality.  Brain: No evidence of acute infarction, hemorrhage, hydrocephalus, or mass lesion/mass effect.  CT CERVICAL SPINE FINDINGS  Negative for acute fracture or subluxation. No prevertebral edema. No gross cervical canal hematoma. No significant osseous canal or foraminal stenosis.  There is a smoothly circumscribed fairly low-density mass which is right peritracheal, measuring 3 cm diameter. There is beaking directed medially and inferiorly.  IMPRESSION: 1. No evidence of intracranial or cervical spine injury. 2. 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 Dedicated imaging is recommended to exclude adenopathy. MRI of the chest with contrast is the preferred modality, but chest CT with contrast would likely be diagnostic.   Electronically Signed   By: Marnee SpringJonathon  Watts M.D.   On: 08/23/2014 01:59   Ct Cervical Spine Wo Contrast  08/23/2014   CLINICAL DATA:  Fall at home with subsequent poor responsiveness.  EXAM: CT HEAD WITHOUT CONTRAST  CT CERVICAL SPINE WITHOUT CONTRAST  TECHNIQUE: Multidetector CT imaging of the head and cervical spine was performed following the standard protocol without intravenous contrast. Multiplanar CT image reconstructions of the cervical spine  were also generated.  COMPARISON:  None.  FINDINGS: CT HEAD FINDINGS  Skull and Sinuses:Negative for fracture or destructive process. The mastoids, middle ears, and imaged paranasal sinuses are clear.  Orbits: No acute abnormality.  Brain: No evidence of acute infarction, hemorrhage, hydrocephalus, or mass lesion/mass effect.  CT CERVICAL SPINE FINDINGS  Negative for acute fracture or subluxation. No prevertebral edema. No gross cervical canal hematoma. No significant osseous canal or foraminal stenosis.  There is a smoothly circumscribed fairly low-density mass which is right peritracheal, measuring 3 cm diameter. There is beaking directed medially and inferiorly.  IMPRESSION: 1. No evidence of intracranial or cervical spine injury. 2. 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 Dedicated imaging is recommended to exclude adenopathy. MRI of the chest with contrast is the preferred modality, but chest CT with contrast would likely be diagnostic.   Electronically Signed   By: Marnee SpringJonathon  Watts M.D.   On: 08/23/2014 01:59     EKG Interpretation   Date/Time:  Wednesday August 22 2014 23:35:03 EDT Ventricular Rate:  80 PR Interval:  176 QRS Duration: 101 QT Interval:  365 QTC Calculation: 421 R Axis:   17 Text Interpretation:  Sinus rhythm Normal ECG No old tracing to compare  Confirmed by [facility] LLCGLICK  MD, Mckinnon Janita Camberos ([id number]) on 08/22/2014 11:49:11 PM      CRITICAL CARE Performed by: UEAVW,UJWJXGLICK,Jevan Gaunt Total critical care time: 30 minutes Critical care time was exclusive of separately billable procedures and treating other patients. Critical care was necessary to treat or prevent imminent or life-threatening deterioration. Critical care was time spent personally by me on the following activities: development of treatment plan with patient and/or surrogate as well as nursing, discussions with consultants, evaluation of patient's response to treatment, examination of patient, obtaining  history from patient or surrogate, ordering and performing treatments and interventions, ordering and review of laboratory studies, ordering and review of radiographic studies, pulse oximetry and re-evaluation of patient's condition.  MDM   Final diagnoses:  Fall at home, initial encounter  Narcotic overdose, accidental or unintentional, initial encounter    Altered  mental status. Old records are reviewed and he was discharged 4 days ago following lumbar surgery and discharge medications included oxycodone 20 mg and diazepam. His wife states he's been taking these on a regular basis. I strongly suspect that he is overly sedated because of these medications. However, with fall, he will need to be sent for CT of head and cervical spine. He is given a dose of naloxone and woke up immediately. He will need to be observed for sometime since the half life of Noroxin is much shorter than the half life of oxycodone. Ankle clonus is likely related to his prior lumbar disease.  Drug screen is positive for opiates, benzodiazepines, and amphetamines which is consistent with his known history. CT scans are unremarkable. Incidental finding of 3 cm peritracheal mass is noted and this was explained to patient and his wife. Outpatient evaluation will be needed. He was observed for 2 hours following administration of naloxone and he has continued on his normal mental status. At this point, I feel he is safe to go home. He he is awake and alert with a naloxone completely out of his system. He is advised to reduce his oxycodone dose from 20 mg to 10 mg.  I personally performed the services described in this documentation, which was scribed in my presence. The recorded information has been reviewed and is accurate.      Shawn Booze, MD 08/23/14 1610  Shawn Booze, MD 08/23/14 [phone]

## 2014-08-22 NOTE — ED Notes (Signed)
Per EMS, pt fell today around 2100. Pt had lumbar spine surgery around 1 week ago. Pt reporting head pain and right hip pain. Pt is lethargic, barely responding to verbal or painful stimuli.

## 2014-08-23 ENCOUNTER — Encounter (HOSPITAL_COMMUNITY): Payer: Self-pay | Admitting: Radiology

## 2014-08-23 ENCOUNTER — Emergency Department (HOSPITAL_COMMUNITY): Payer: 59

## 2014-08-23 LAB — URINALYSIS, ROUTINE W REFLEX MICROSCOPIC
Bilirubin Urine: NEGATIVE
Glucose, UA: NEGATIVE mg/dL
Hgb urine dipstick: NEGATIVE
Ketones, ur: NEGATIVE mg/dL
Leukocytes, UA: NEGATIVE
Nitrite: NEGATIVE
Protein, ur: NEGATIVE mg/dL
SPECIFIC GRAVITY, URINE: 1.021 (ref 1.005–1.030)
UROBILINOGEN UA: 1 mg/dL (ref 0.0–1.0)
pH: 5.5 (ref 5.0–8.0)

## 2014-08-23 LAB — COMPREHENSIVE METABOLIC PANEL
ALT: 35 U/L (ref 0–53)
AST: 29 U/L (ref 0–37)
Albumin: 3.4 g/dL — ABNORMAL LOW (ref 3.5–5.2)
Alkaline Phosphatase: 64 U/L (ref 39–117)
Anion gap: 6 (ref 5–15)
BUN: 12 mg/dL (ref 6–23)
CALCIUM: 9.3 mg/dL (ref 8.4–10.5)
CO2: 34 mmol/L — AB (ref 19–32)
Chloride: 99 mmol/L (ref 96–112)
Creatinine, Ser: 0.91 mg/dL (ref 0.50–1.35)
GFR calc non Af Amer: >90 mL/min (ref >90)
GLUCOSE: 107 mg/dL — AB (ref 70–99)
Potassium: 4.2 mmol/L (ref 3.5–5.1)
Sodium: 139 mmol/L (ref 135–145)
TOTAL PROTEIN: 6.6 g/dL (ref 6.0–8.3)
Total Bilirubin: 0.4 mg/dL (ref 0.3–1.2)

## 2014-08-23 LAB — RAPID URINE DRUG SCREEN, HOSP PERFORMED
Amphetamines: POSITIVE — AB
BARBITURATES: NOT DETECTED
Benzodiazepines: POSITIVE — AB
COCAINE: NOT DETECTED
OPIATES: POSITIVE — AB
Tetrahydrocannabinol: NOT DETECTED

## 2014-08-23 LAB — ETHANOL: Alcohol, Ethyl (B): <5 mg/dL (ref 0–9)

## 2014-08-23 LAB — ACETAMINOPHEN LEVEL

## 2014-08-23 LAB — SALICYLATE LEVEL: Salicylate Lvl: <4 mg/dL (ref 2.8–20.0)

## 2014-08-23 LAB — TROPONIN I

## 2014-08-23 NOTE — Discharge Instructions (Signed)
Reduce your Oxycodone to 10 mg every 3-4 hours as needed for pain. Continue your other medications.

## 2015-04-28 IMAGING — CT CT CERVICAL SPINE W/O CM
4 of 6 series · 14 of 33 positions shown, 16 images · non-contrast
Comparison: None.

CLINICAL DATA: Fall at home with subsequent poor responsiveness.

EXAM:
CT HEAD WITHOUT CONTRAST
CT CERVICAL SPINE WITHOUT CONTRAST
TECHNIQUE: Multidetector CT imaging of the head and cervical spine was
performed following the standard protocol without intravenous
contrast. Multiplanar CT image reconstructions of the cervical spine
were also generated.

[Series 302: soft tissue, idose (2) · axial · 0.39mm/px · z∈[+681,+787]mm · 3 of 107 slices shown]
[im 27/107  soft-tissue]
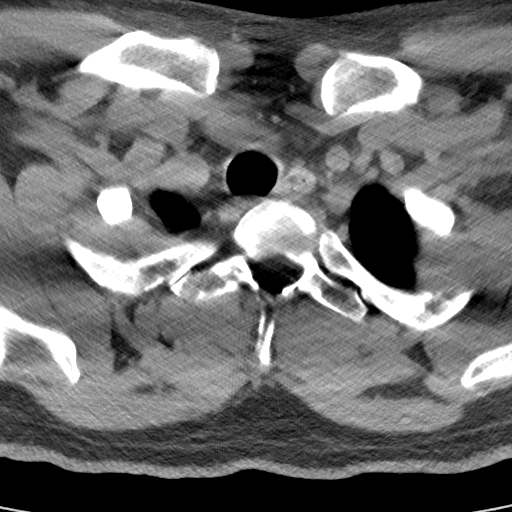
[im 54/107  soft-tissue]
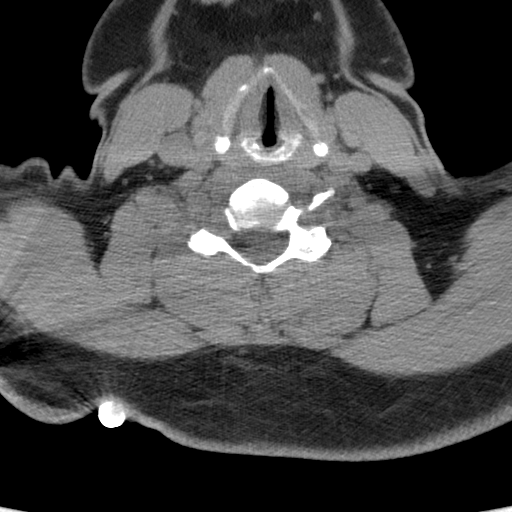
[im 80/107  soft-tissue]
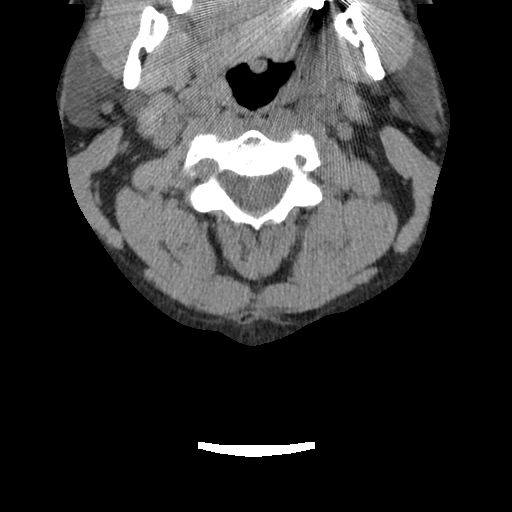

[Series 304: coronal, idose (2) · coronal · 0.36mm/px · 3 of 66 slices shown]
[im 14/66  bone]
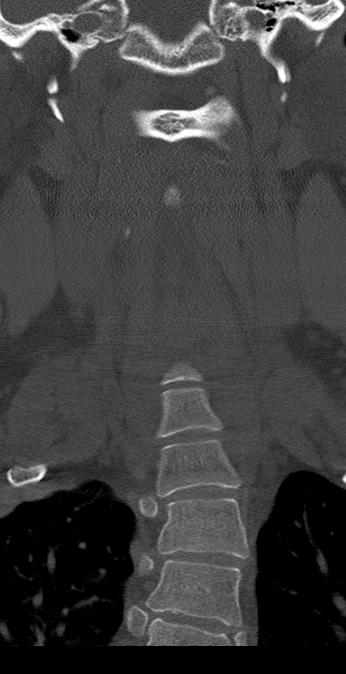
[im 27/66  bone]
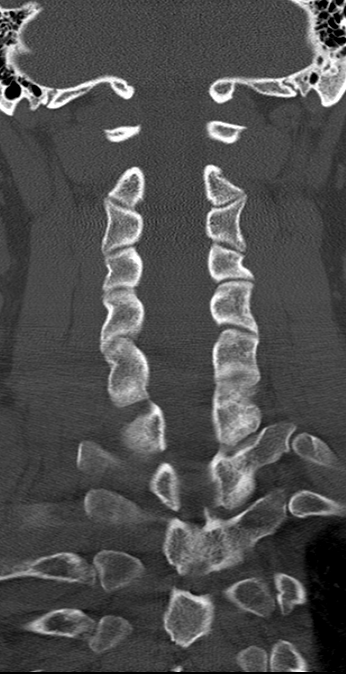
[im 40/66  bone]
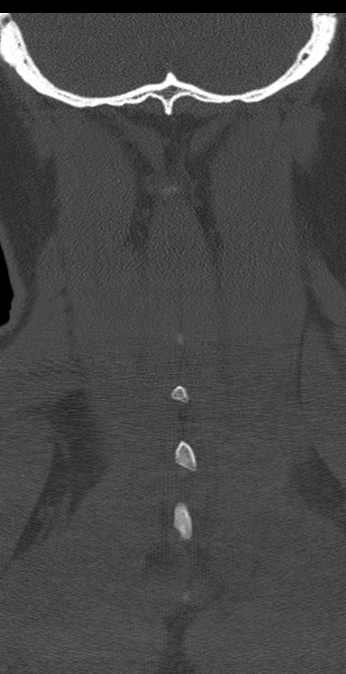

[Series 305: sagittal, idose (2) · sagittal · 0.36mm/px · 5 of 61 slices shown, 6 images]
[im 21/61  bone]
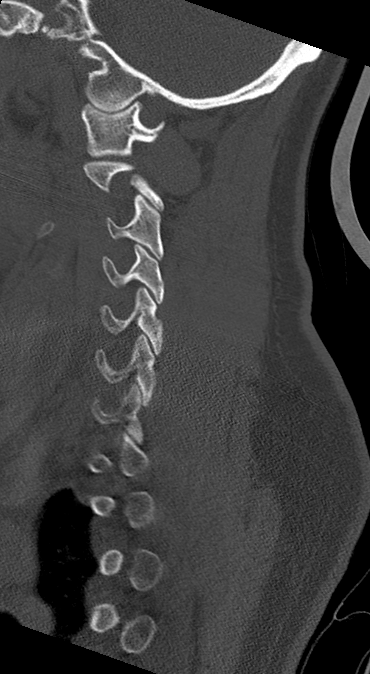
[im 26/61  bone]
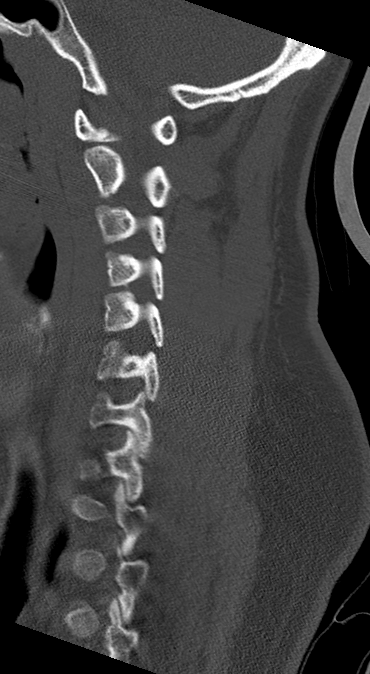
[im 31/61  soft-tissue]
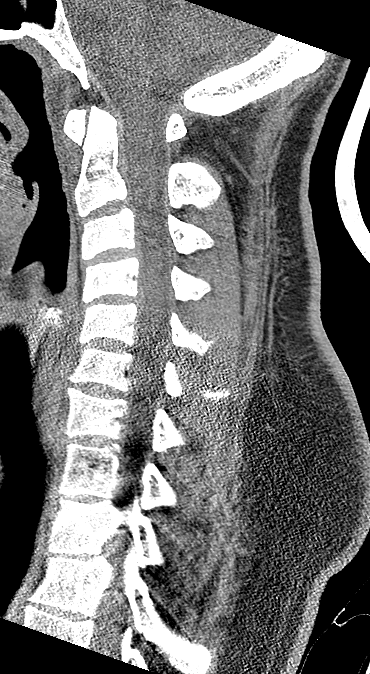
[im 31/61  bone]
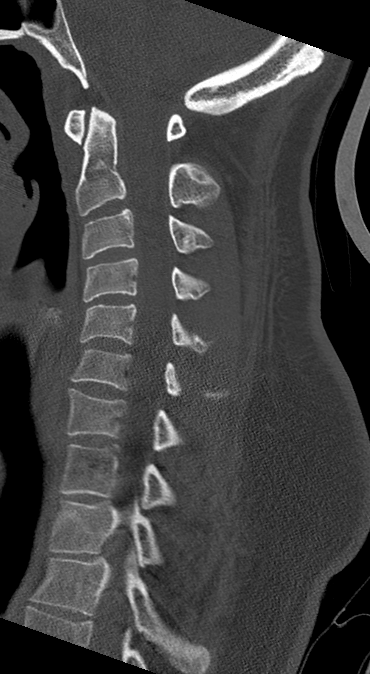
[im 36/61  bone]
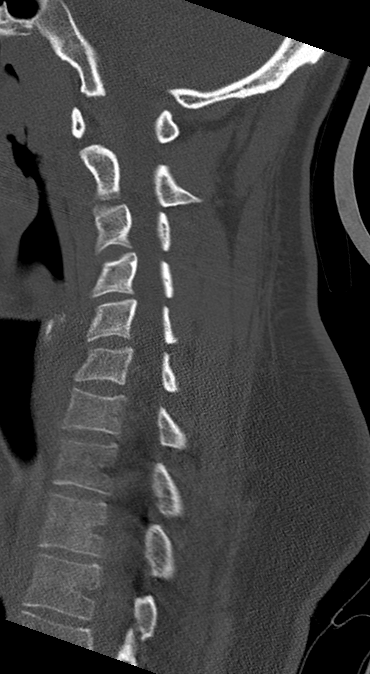
[im 41/61  bone]
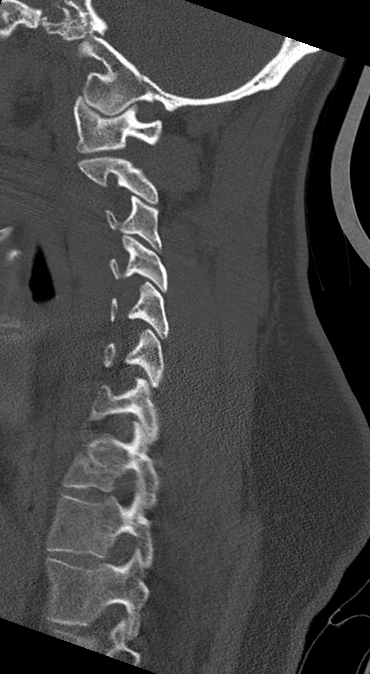

[Series 306: orthogonals, idose (2) · axial · 0.40mm/px · z∈[+665,+778]mm · 3 of 120 slices shown, 4 images]
[im 30/120  soft-tissue]
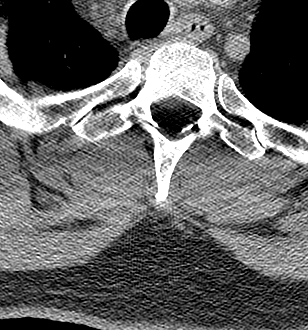
[im 30/120  bone]
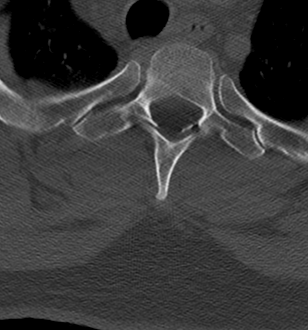
[im 60/120  bone]
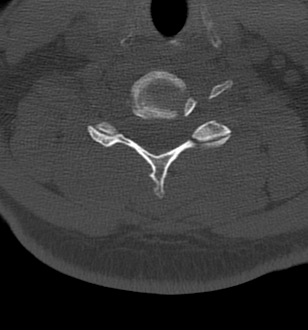
[im 90/120  bone]
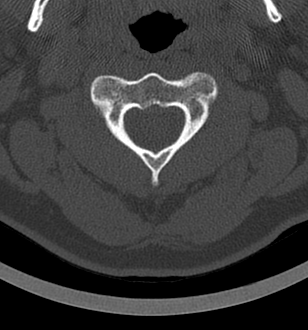

[14 of 33 positions shown; findings below may reference images not displayed]

FINDINGS: CT HEAD FINDINGS

Skull and Sinuses:Negative for fracture or destructive process. The
mastoids, middle ears, and imaged paranasal sinuses are clear.

Orbits: No acute abnormality.

Brain: No evidence of acute infarction, hemorrhage, hydrocephalus,
or mass lesion/mass effect.

CT CERVICAL SPINE FINDINGS

Negative for acute fracture or subluxation. No prevertebral edema.
No gross cervical canal hematoma. No significant osseous canal or
foraminal stenosis.

There is a smoothly circumscribed fairly low-density mass which is
right peritracheal, measuring 3 cm diameter. There is beaking
directed medially and inferiorly.
IMPRESSION: 1. No evidence of intracranial or cervical spine injury.
2. 3 cm right peritracheal mass, favor a pericardial or foregut
duplication cyst. Dedicated imaging is recommended to exclude
adenopathy. MRI of the chest with contrast is the preferred
modality, but chest CT with contrast would likely be diagnostic.

## 2019-05-17 ENCOUNTER — Other Ambulatory Visit: Payer: Self-pay

## 2019-05-17 ENCOUNTER — Emergency Department (HOSPITAL_COMMUNITY)
Admission: EM | Admit: 2019-05-17 | Discharge: 2019-05-17 | Disposition: A | Discharge status: Home / Self Care | Payer: Managed Care, Other (non HMO) | Attending: Emergency Medicine | Admitting: Emergency Medicine

## 2019-05-17 ENCOUNTER — Encounter (HOSPITAL_COMMUNITY): Payer: Self-pay | Admitting: Emergency Medicine

## 2019-05-17 ENCOUNTER — Emergency Department (HOSPITAL_COMMUNITY): Payer: Managed Care, Other (non HMO)

## 2019-05-17 DIAGNOSIS — U071 COVID-19: Secondary | ICD-10-CM | POA: Diagnosis not present

## 2019-05-17 DIAGNOSIS — R0602 Shortness of breath: Secondary | ICD-10-CM | POA: Diagnosis present

## 2019-05-17 DIAGNOSIS — Z79899 Other long term (current) drug therapy: Secondary | ICD-10-CM | POA: Diagnosis not present

## 2019-05-17 DIAGNOSIS — K921 Melena: Secondary | ICD-10-CM | POA: Insufficient documentation

## 2019-05-17 DIAGNOSIS — F17220 Nicotine dependence, chewing tobacco, uncomplicated: Secondary | ICD-10-CM | POA: Insufficient documentation

## 2019-05-17 LAB — CBC
HCT: 45.1 % (ref 39.0–52.0)
Hemoglobin: 14.9 g/dL (ref 13.0–17.0)
MCH: 29.3 pg (ref 26.0–34.0)
MCHC: 33 g/dL (ref 30.0–36.0)
MCV: 88.6 fL (ref 80.0–100.0)
Platelets: 326 10*3/uL (ref 150–400)
RBC: 5.09 MIL/uL (ref 4.22–5.81)
RDW: 12.6 % (ref 11.5–15.5)
WBC: 6.3 10*3/uL (ref 4.0–10.5)
nRBC: 0 % (ref 0.0–0.2)

## 2019-05-17 LAB — BASIC METABOLIC PANEL
Anion gap: 9 (ref 5–15)
BUN: 11 mg/dL (ref 6–20)
CO2: 27 mmol/L (ref 22–32)
Calcium: 9.5 mg/dL (ref 8.9–10.3)
Chloride: 103 mmol/L (ref 98–111)
Creatinine, Ser: 1.03 mg/dL (ref 0.61–1.24)
GFR calc Af Amer: >60 mL/min (ref >60)
GFR calc non Af Amer: >60 mL/min (ref >60)
Glucose, Bld: 127 mg/dL — ABNORMAL HIGH (ref 70–99)
Potassium: 4 mmol/L (ref 3.5–5.1)
Sodium: 139 mmol/L (ref 135–145)

## 2019-05-17 LAB — POC OCCULT BLOOD, ED: Fecal Occult Bld: POSITIVE — AB

## 2019-05-17 LAB — TROPONIN I (HIGH SENSITIVITY): Troponin I (High Sensitivity): 2 ng/L (ref <18)

## 2019-05-17 MED ORDER — ALBUTEROL SULFATE HFA 108 (90 BASE) MCG/ACT IN AERS
2.0000 | INHALATION_SPRAY | Freq: Once | RESPIRATORY_TRACT | Status: AC
  Administered 2019-05-17: 2 via RESPIRATORY_TRACT
  Filled 2019-05-17: qty 6.7

## 2019-05-17 MED ORDER — IPRATROPIUM BROMIDE HFA 17 MCG/ACT IN AERS
2.0000 | INHALATION_SPRAY | Freq: Once | RESPIRATORY_TRACT | Status: AC
  Administered 2019-05-17: 16:00:00 2 via RESPIRATORY_TRACT
  Filled 2019-05-17 (×2): qty 12.9

## 2019-05-17 NOTE — ED Triage Notes (Signed)
Pt reports being tested for covid on the 20th and was in bed for 5 days. States he began to feel better but woke up very SOB and chest tightness.

## 2019-05-17 NOTE — Discharge Instructions (Addendum)
Follow-up with your gastroenterologist.  Please return if you had new or concerning symptoms.  Use inhalers as prescribed

## 2019-05-17 NOTE — ED Provider Notes (Signed)
Ambulatory pulse ox 96-98% on room air no tachypnea or dyspnea during walking. No CP or increased SOB. HR remains elevated at 114.    Shawn Mcdonald [place], [place] 05/17/19 1334    Drenda Freeze, MD 05/18/19 [phone]

## 2019-05-17 NOTE — ED Provider Notes (Addendum)
[facility] EMERGENCY DEPARTMENT Provider Note   CSN: [id number] Arrival date & time: 05/17/19  1219     History Chief Complaint  Patient presents with  . covid+  . Shortness of Breath    Shawn Mcdonald is a 38 y.o. male.  HPI   Patient is 38 year old male with a history of GERD and chronic back pain status post surgery.  Also history of anxiety.  Patient tested positive for COVID-19 on the 20th has had symptoms since.  States he has had congestion, fevers, chills, chest pressure, shortness of breath, fatigue that have been constant.  Also does endorse some lightheadedness no sore throat patient has a trip and congestion.  Patient states he feels lightheaded when he bends over that is when he is tying his shoes and straightens back up.  Patient states he has had sternal chest pressure for the past 24 hours that has been constant but waxing and waning never really gone.  Denies any exertional chest pain, pleuritic chest pains chest pain, nausea or vomiting.  Does endorse some diaphoresis.  States that he has been feeling diaphoretic for the last week however.  Patient does not smoke, has no history of diabetes or hypertension, has a history of blood clots, no hemoptysis, no leg swelling, states that he does have a family history of ACS with his father having bypass at age 39.  A 49 year old patient with a history of obesity presents for evaluation of chest pain. Initial onset of pain was more than 6 hours ago. The patient's chest pain is described as heaviness/pressure/tightness and is not worse with exertion. The patient's chest pain is not middle- or left-sided, is not well-localized, is not sharp and does not radiate to the arms/jaw/neck. The patient does not complain of nausea and denies diaphoresis. The patient has a family history of coronary artery disease in a first-degree relative with onset less than age 56. The patient has no history of stroke, has no history of  peripheral artery disease, has not smoked in the past 90 days, denies any history of treated diabetes, is not hypertensive and has no history of hypercholesterolemia.     Past Medical History:  Diagnosis Date  . Anxiety    takes Prozac daily  . Chronic back pain    spondylolisthesis/scoliosis  . GERD (gastroesophageal reflux disease)    hx of-many yrs ago  . History of shingles   . Numbness and tingling    weakness and related to back issues  . PTSD (post-traumatic stress disorder)    takes Adderall daily    Patient Active Problem List   Diagnosis Date Noted  . Spondylolisthesis of lumbar region 08/14/2014    Past Surgical History:  Procedure Laterality Date  . NO PAST SURGERIES         No family history on file.  Social History   Tobacco Use  . Smoking status: Never Smoker  . Smokeless tobacco: Current User    Types: Chew  Substance Use Topics  . Alcohol use: Yes    Comment: rarely  . Drug use: No    Home Medications Prior to Admission medications   Medication Sig Start Date End Date Taking? Authorizing Provider  ADDERALL XR 20 MG 24 hr capsule Take 40 mg by mouth daily. 05/08/19  Yes [provider]  FLUoxetine (PROZAC) 40 MG capsule Take 40 mg by mouth every morning. 05/06/19  Yes [provider]    Allergies    Patient has no  known allergies.  Review of Systems   Review of Systems  Constitutional: Positive for chills and fever.  HENT: Positive for congestion, postnasal drip and sore throat.   Eyes: Negative for pain.  Respiratory: Positive for cough, chest tightness and shortness of breath.   Cardiovascular: Negative for chest pain and leg swelling.  Gastrointestinal: Negative for abdominal pain and vomiting.  Genitourinary: Negative for dysuria.  Musculoskeletal: Negative for myalgias.  Skin: Negative for rash.  Neurological: Positive for light-headedness. Negative for dizziness and headaches.    Physical Exam Updated Vital  Signs BP 127/87 (BP Location: Right Arm)   Pulse 65   Temp 98.4 F (36.9 C) (Oral)   Resp 20   Ht 5\' 10"  (1.778 m)   Wt 113.4 kg   SpO2 98%   BMI 35.87 kg/m   Physical Exam Vitals and nursing note reviewed.  Constitutional:      General: He is not in acute distress. HENT:     Head: Normocephalic and atraumatic.     Nose: Nose normal.     Mouth/Throat:     Mouth: Mucous membranes are moist.  Eyes:     General: No scleral icterus. Cardiovascular:     Rate and Rhythm: Regular rhythm. Tachycardia present.     Pulses: Normal pulses.     Heart sounds: Normal heart sounds.     Comments: Rate 110 Pulmonary:     Effort: Pulmonary effort is normal. No respiratory distress.     Breath sounds: No wheezing.     Comments: Lung sounds are clear to auscultation bilaterally, no wheezing, no rhonchi, no rales, no increased work of breathing speaking full sentences. Abdominal:     Palpations: Abdomen is soft.     Tenderness: There is no abdominal tenderness.  Genitourinary:    Rectum: Guaiac result positive.     Comments: No obvious hematochezia or melena on digital rectal exam, guaiac positive  No hemorrhoid, fissure or other abnormality on external exam.  No palpable abnormalities on internal exam. Musculoskeletal:     Cervical back: Normal range of motion.     Right lower leg: No edema.     Left lower leg: No edema.  Skin:    General: Skin is warm and dry.     Capillary Refill: Capillary refill takes less than 2 seconds.  Neurological:     Mental Status: He is alert. Mental status is at baseline.  Psychiatric:        Mood and Affect: Mood normal.        Behavior: Behavior normal.     ED Results / Procedures / Treatments   Labs (all labs ordered are listed, but only abnormal results are displayed) Labs Reviewed  BASIC METABOLIC PANEL - Abnormal; Notable for the following components:      Result Value   Glucose, Bld 127 (*)    All other components within normal limits    POC OCCULT BLOOD, ED - Abnormal; Notable for the following components:   Fecal Occult Bld POSITIVE (*)    All other components within normal limits  CBC  TROPONIN I (HIGH SENSITIVITY)    EKG None ED ECG REPORT   Date: 06/13/2019  Rate: 122  Rhythm: sinus tachycardia  QRS Axis: normal  Intervals: normal  ST/T Wave abnormalities: nonspecific ST/T changes  Conduction Disutrbances:none  Narrative Interpretation:   Old EKG Reviewed: Faster than prior.   I have personally reviewed the EKG tracing and agree with the computerized printout as noted.  Radiology No results found.  Procedures Procedures (including critical care time)  Medications Ordered in ED Medications  albuterol (VENTOLIN HFA) 108 (90 Base) MCG/ACT inhaler 2 puff (2 puffs Inhalation Given 05/17/19 1629)  ipratropium (ATROVENT HFA) inhaler 2 puff (2 puffs Inhalation Given 05/17/19 1629)    ED Course  I have reviewed the triage vital signs and the nursing notes.  Pertinent labs & imaging results that were available during my care of the patient were reviewed by me and considered in my medical decision making (see chart for details).  Clinical Course as of Jun 12 730  Wed May 17, 2019  1507 Chest x-ray without any infiltrate, pneumothorax, heart size within normal limits  DG Chest Portable 1 View [WF]  1508 No leukocytosis  CBC [WF]  1508 No electrolyte abnormalities  Basic metabolic panel(!) [WF]  1508 Positive fecal occult.  No obvious melena on my exam  POC occult blood, ED(!) [WF]  1508 Troponin I (High Sensitivity) [WF]  1508 Troponin x1 within normal limits.  Following chest pain algorithm with high-sensitivity troponin [facility] patient is reasonable discharge with follow-up with primary care.  Troponin I (High Sensitivity) [WF]    Clinical Course User Index [WF] Gailen ShelterFondaw, Owynn Mosqueda S, [place]   MDM Rules/Calculators/A&P                      Patient is 38 year old male presented today with cough, chest  pressure, fatigue.  Patient tested positive for Covid 12/20.  States he has felt ill since that time.  He endorses new chest tightness that began yesterday.  Patient is without any history of blood clots.  Has no unilateral leg swelling denies any chest pain but describes as pressure.  Low suspicion for pulmonary embolism.  Due to patient's family history of ACS will place patient on chest pain algorithm although he is chest pain-free.  EKG is nonischemic and initial troponin is less than 4 with heart score of 2 patient is reasonable for discharge.  Discussed results of patient's work-up with patient he is understanding of results and amenable to be sending home.  Lab results interpreted above.  Chest x-ray is without abnormality he is no leukocytosis.  Patient did bring up that he has had melena for 1 month.  No obvious melena on my rectal exam however he is occult blood test is positive.  Hematocrit within normal limits.  Patient does not appear to be anemic.  I discussed his results with patient and he does understand that he needs to follow-up with gastroenterology for further evaluation of his melena.  Patient is not currently taking Pepto-Bismol or iron.  Doubt other iatrogenic cause of patient's melena.  Recommend patient not use ibuprofen in the interim.  Discharged with albuterol and Atrovent for symptomatic use.  He will follow-up with gastroenterology and is primary care doctor.  On reevaluation patient states he still has some chest tightness.  It has been constant for the past day and a half therefore with negative troponins doubt NSTEMI.  EKG negative for STEMI.  Doubt unstable angina or stable angina.  Patient has any pain is nonradiating nor is exertional.  Doubt pericarditis.  Patient's heart is within normal limits of size with no evidence of effusion on chest x-ray.  EKG is without evidence of pericarditis.  Strict return precautions.  On discharge evaluation pt HR is 83 with pulse ox  98% on room air.  Sim BoastJoshua Mian was evaluated in Emergency Department on 06/13/2019 for  the symptoms described in the history of present illness. He was evaluated in the context of the global COVID-19 pandemic, which necessitated consideration that the patient might be at risk for infection with the SARS-CoV-2 virus that causes COVID-19. Institutional protocols and algorithms that pertain to the evaluation of patients at risk for COVID-19 are in a state of rapid change based on information released by regulatory bodies including the CDC and federal and state organizations. These policies and algorithms were followed during the patient's care in the ED.   This patient appears reasonably screened and I doubt any other medical condition requiring further workup, evaluation, or treatment in the ED at this time prior to discharge.   Patient's vitals are WNL apart from vital sign abnormalities discussed above, patient is in NAD, and able to ambulate in the ED at their baseline. Pain has been managed or a plan has been made for home management and has no complaints prior to discharge. Patient is comfortable with above plan and is stable for discharge at this time. All questions were answered prior to disposition. Results from the ER workup discussed with the patient face to face and all questions answered to the best of my ability. The patient is safe for discharge with strict return precautions. Patient appears safe for discharge with appropriate follow-up. Conveyed my impression with the patient and they voiced understanding and are agreeable to plan.   An After Visit Summary was printed and given to the patient.  Portions of this note were generated with Scientist, clinical (histocompatibility and immunogenetics). Dictation errors may occur despite best attempts at proofreading.     Final Clinical Impression(s) / ED Diagnoses Final diagnoses:  COVID-19  Melena    Rx / DC Orders ED Discharge Orders    None       Gailen Shelter, [place] 05/17/19 1529    Charlynne Pander, MD 05/18/19 0850    Gailen Shelter, PA 06/13/19 7829    Charlynne Pander, MD 06/13/19 [phone]

## 2019-07-05 ENCOUNTER — Other Ambulatory Visit: Payer: Self-pay | Admitting: Physician Assistant

## 2019-07-05 DIAGNOSIS — K625 Hemorrhage of anus and rectum: Secondary | ICD-10-CM

## 2019-07-05 DIAGNOSIS — R197 Diarrhea, unspecified: Secondary | ICD-10-CM

## 2019-07-05 DIAGNOSIS — R1031 Right lower quadrant pain: Secondary | ICD-10-CM

## 2019-07-07 ENCOUNTER — Ambulatory Visit
Admission: RE | Admit: 2019-07-07 | Discharge: 2019-07-07 | Disposition: A | Discharge status: Home / Self Care | Payer: Managed Care, Other (non HMO) | Source: Ambulatory Visit | Attending: Physician Assistant | Admitting: Physician Assistant

## 2019-07-07 DIAGNOSIS — K625 Hemorrhage of anus and rectum: Secondary | ICD-10-CM

## 2019-07-07 DIAGNOSIS — R1031 Right lower quadrant pain: Secondary | ICD-10-CM

## 2019-07-07 DIAGNOSIS — R197 Diarrhea, unspecified: Secondary | ICD-10-CM

## 2019-07-07 MED ORDER — IOPAMIDOL (ISOVUE-300) INJECTION 61%
125.0000 mL | Freq: Once | INTRAVENOUS | Status: AC | PRN
  Administered 2019-07-07: 15:00:00 125 mL via INTRAVENOUS

## 2020-01-20 IMAGING — DX DG CHEST 1V PORT
1 series · 1 of 1 positions shown · non-contrast
Comparison: None.

CLINICAL DATA: Shortness of breath and chest tightness

EXAM:
PORTABLE CHEST 1 VIEW

[chest ap]
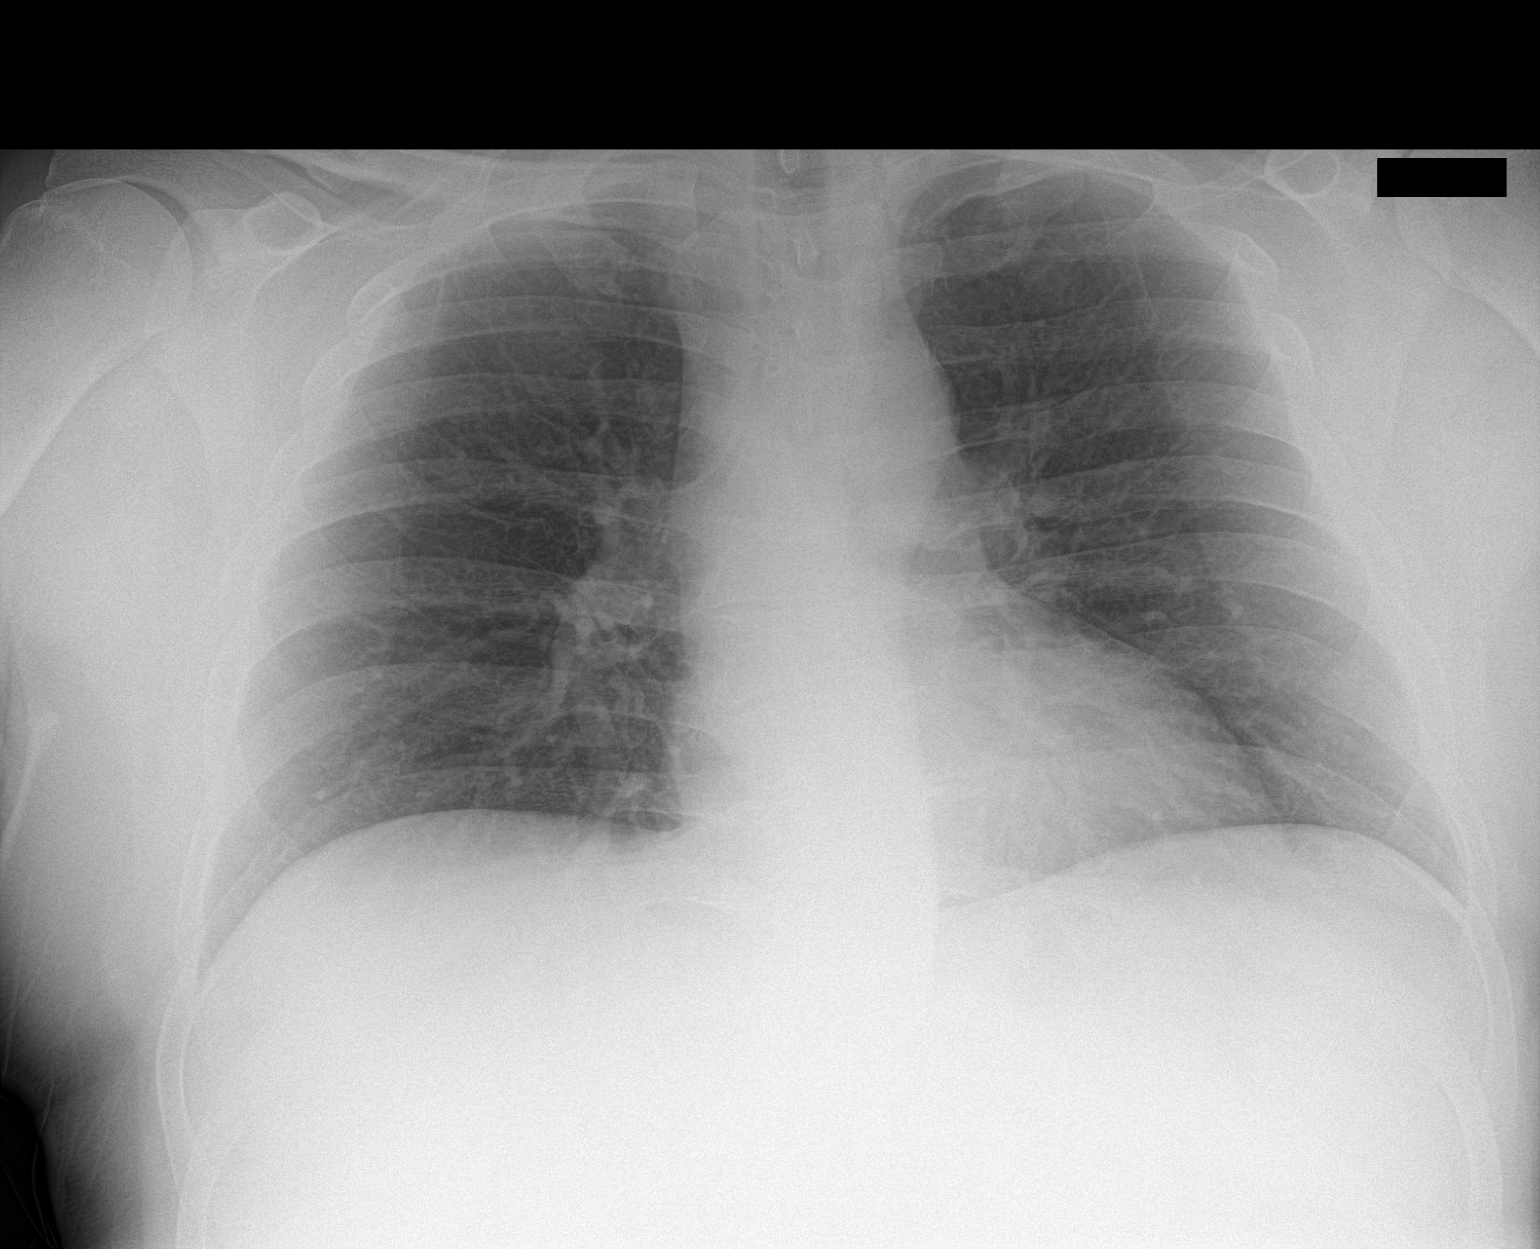

[1 of 1 positions shown; findings below may reference images not displayed]

FINDINGS: Lungs are clear. Heart size and pulmonary vascularity are normal. No
adenopathy. No bone lesions.
IMPRESSION: No abnormality noted.

## 2020-03-11 IMAGING — CT CT ABD-PELV W/ CM
2 of 4 series · 12 of 46 positions shown, 14 images · IV contrast (iopamidol)
Comparison: CT the abdomen and pelvis 09/14/2017.

CLINICAL DATA: 38-year-old male with history of right lower
quadrant abdominal pain. Diarrhea with blood in the stool. Bilateral
flank pain and mid to lower pelvic pain for the past 3 months.

EXAM:
CT ABDOMEN AND PELVIS WITH CONTRAST
TECHNIQUE: Multidetector CT imaging of the abdomen and pelvis was performed
using the standard protocol following bolus administration of
intravenous contrast.
CONTRAST:  125mL I0TBHC-UTT IOPAMIDOL (I0TBHC-UTT) INJECTION 61%

[Series 2: abd pelvis 5.00 br40 s3 axial · axial · 0.73mm/px · z∈[+1173,+1588]mm · 9 of 103 slices shown, 11 images]
[im 10/103  soft-tissue]
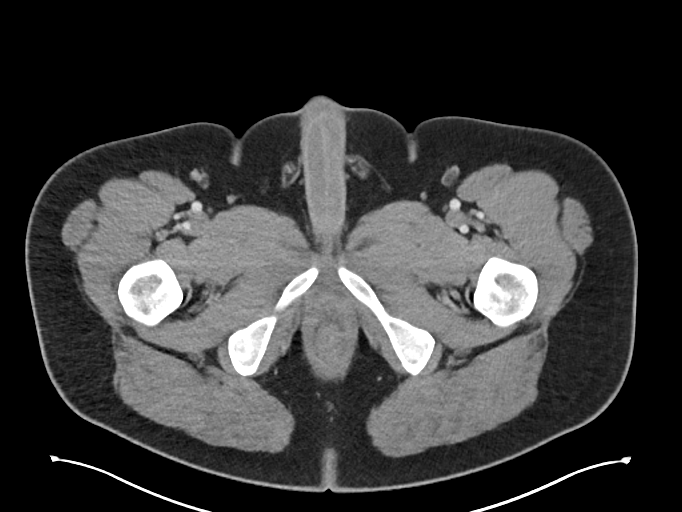
[im 10/103  bone]
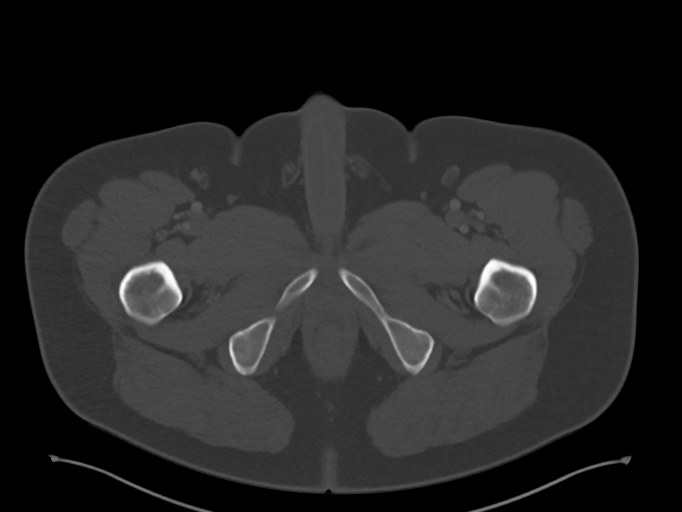
[im 19/103  soft-tissue]
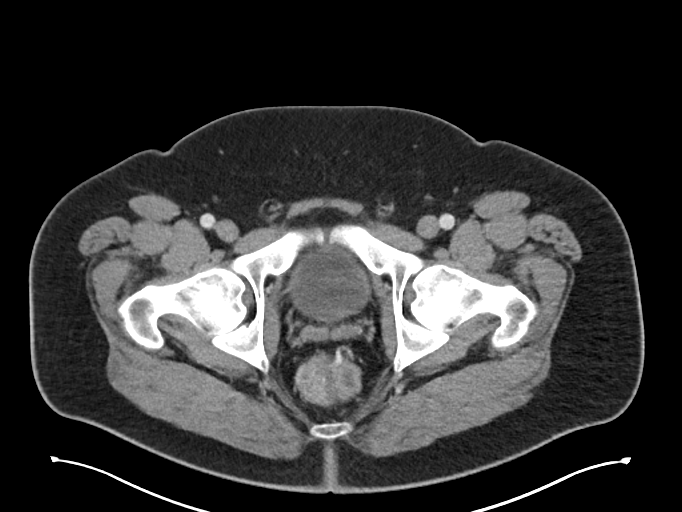
[im 28/103  soft-tissue]
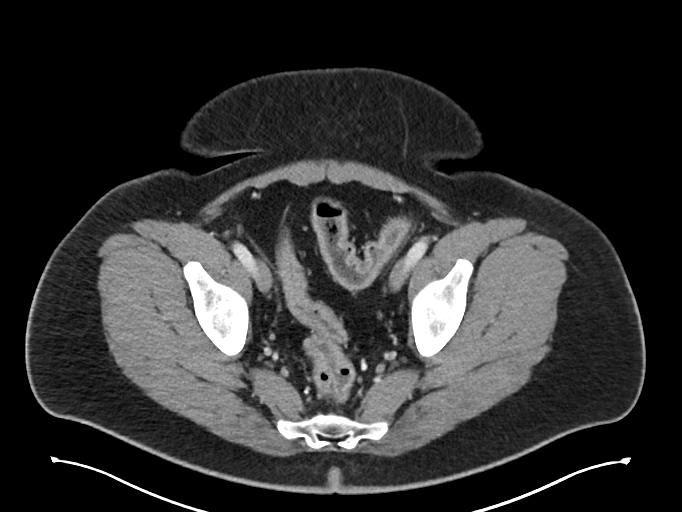
[im 42/103  soft-tissue]
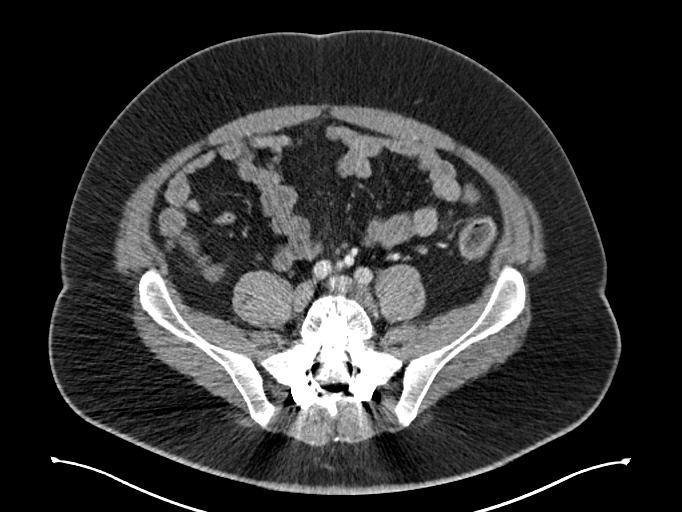
[im 52/103  soft-tissue]
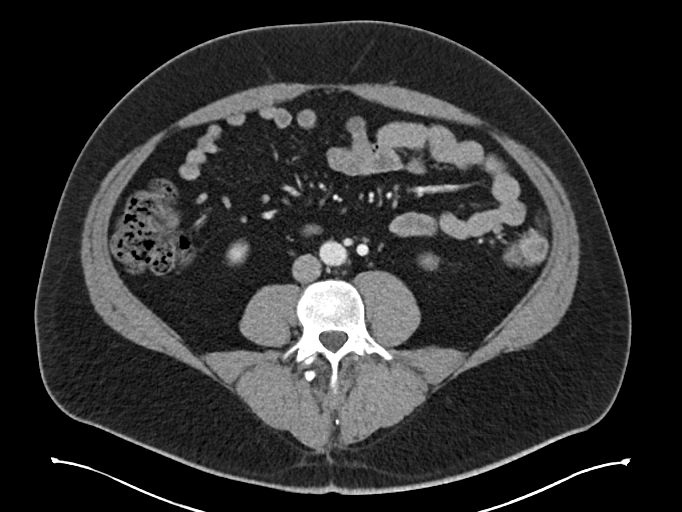
[im 61/103  soft-tissue]
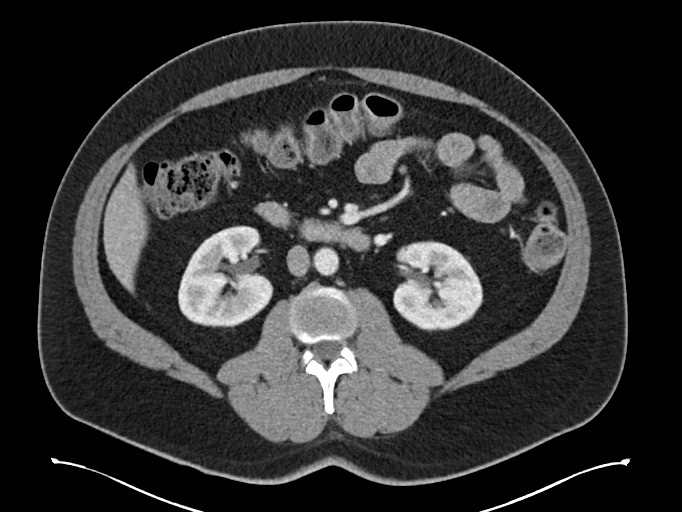
[im 75/103  soft-tissue]
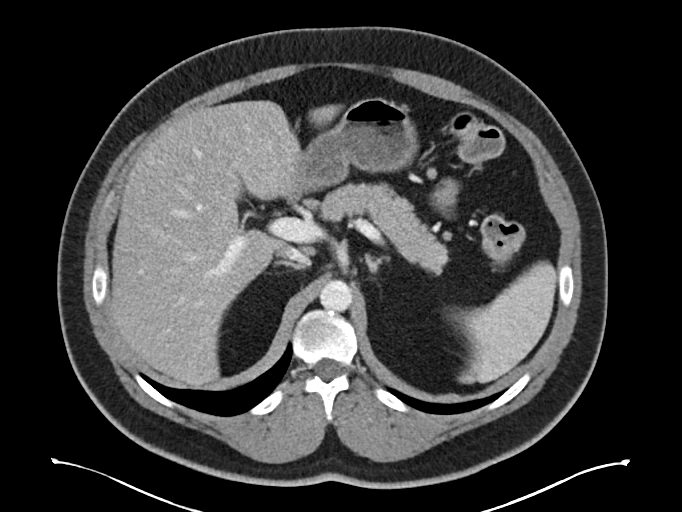
[im 84/103  soft-tissue]
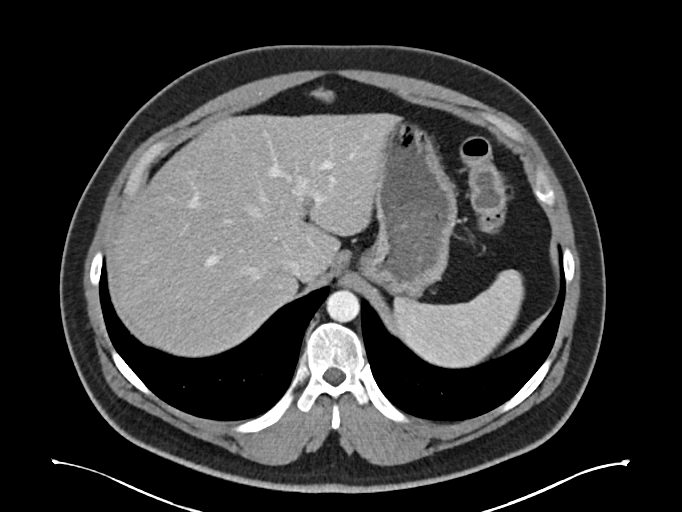
[im 93/103  soft-tissue]
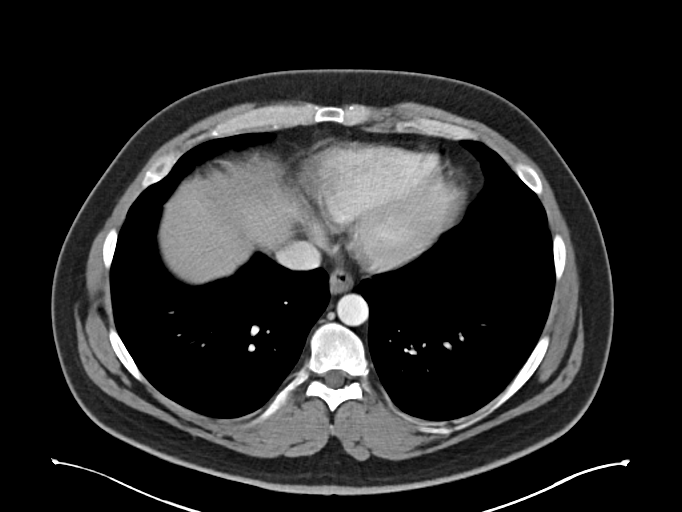
[im 93/103  bone]
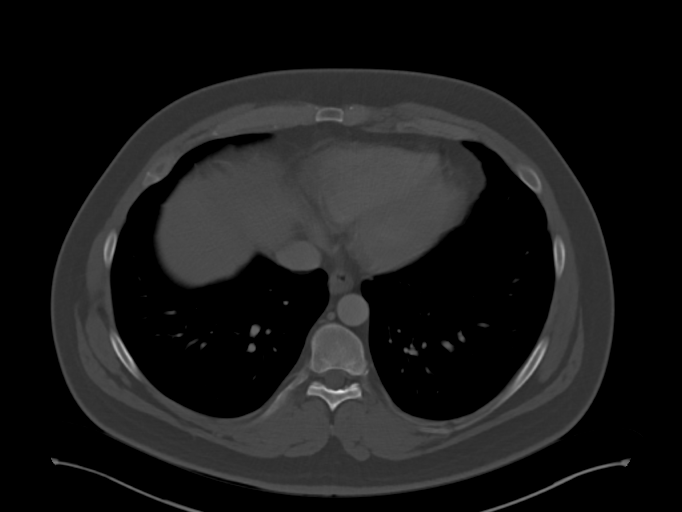

[Series 6: abd pelvis 2.00 br40 s3 cor · coronal · 0.91mm/px · 3 of 175 slices shown]
[im 59/175  soft-tissue]
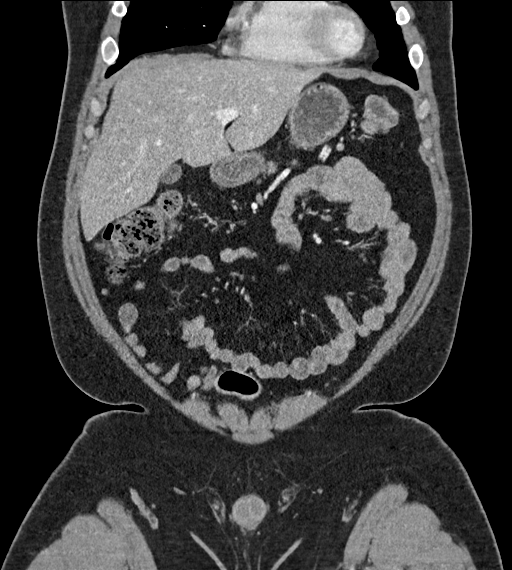
[im 78/175  soft-tissue]
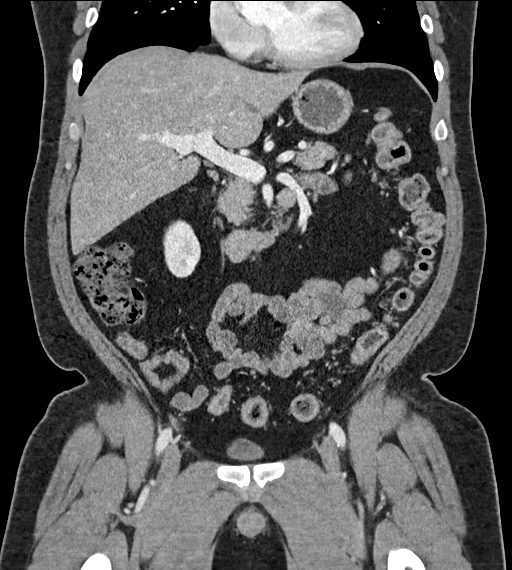
[im 97/175  soft-tissue]
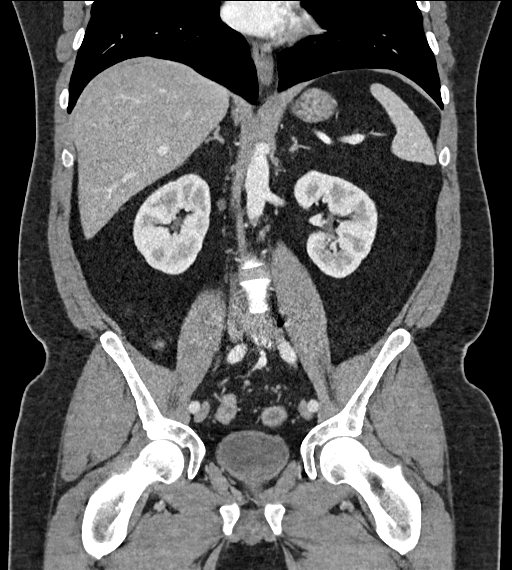

[12 of 46 positions shown; findings below may reference images not displayed]

FINDINGS: Lower chest: Unremarkable.

Hepatobiliary: No suspicious cystic or solid hepatic lesions. No
intra or extrahepatic biliary ductal dilatation. Gallbladder is
normal in appearance.

Pancreas: No pancreatic mass. No pancreatic ductal dilatation. No
pancreatic or peripancreatic fluid collections or inflammatory
changes.

Spleen: Unremarkable.

Adrenals/Urinary Tract: Bilateral kidneys and adrenal glands are
normal in appearance. No hydroureteronephrosis. Urinary bladder is
normal in appearance.

Stomach/Bowel: Normal appearance of the stomach. No pathologic
dilatation of small bowel or colon. Very mild mural thickening with
mucosal hyperenhancement involving the rectum and colon extending to
the level of the proximal sigmoid colon. There is associated
hypervascularity in the associated sigmoid mesocolon and mesorectum,
and multiple prominent but nonenlarged meso colic and mesorectal
lymph nodes (likely reactive). Normal appendix.

Vascular/Lymphatic: No significant atherosclerotic disease, aneurysm
or dissection noted in the abdominal or pelvic vasculature. No
pathologically enlarged lymph nodes noted in the abdomen or pelvis.

Reproductive: Prostate gland and seminal vesicles are unremarkable
in appearance.

Other: No significant volume of ascites.  No pneumoperitoneum.

Musculoskeletal: Status post PLIF at L5-S1 with interbody cage at
L5-S1. There are no aggressive appearing lytic or blastic lesions
noted in the visualized portions of the skeleton.
IMPRESSION: 1. There is a spectrum of findings concerning for proctocolitis.
Given the distribution, this could represent mild ulcerative
colitis. Further clinical evaluation is recommended.
2. Additional incidental findings, as above.

## 2021-12-14 DIAGNOSIS — L03221 Cellulitis of neck: Secondary | ICD-10-CM | POA: Diagnosis not present

## 2021-12-14 DIAGNOSIS — L0211 Cutaneous abscess of neck: Secondary | ICD-10-CM | POA: Diagnosis not present

## 2022-05-16 DIAGNOSIS — L03811 Cellulitis of head [any part, except face]: Secondary | ICD-10-CM | POA: Diagnosis not present

## 2022-05-16 DIAGNOSIS — L720 Epidermal cyst: Secondary | ICD-10-CM | POA: Diagnosis not present

## 2022-06-18 DIAGNOSIS — F4312 Post-traumatic stress disorder, chronic: Secondary | ICD-10-CM | POA: Diagnosis not present

## 2022-07-16 DIAGNOSIS — F4312 Post-traumatic stress disorder, chronic: Secondary | ICD-10-CM | POA: Diagnosis not present

## 2022-07-30 DIAGNOSIS — F4312 Post-traumatic stress disorder, chronic: Secondary | ICD-10-CM | POA: Diagnosis not present

## 2022-08-05 DIAGNOSIS — Z6838 Body mass index (BMI) 38.0-38.9, adult: Secondary | ICD-10-CM | POA: Diagnosis not present

## 2022-08-05 DIAGNOSIS — E785 Hyperlipidemia, unspecified: Secondary | ICD-10-CM | POA: Diagnosis not present

## 2022-08-05 DIAGNOSIS — M109 Gout, unspecified: Secondary | ICD-10-CM | POA: Diagnosis not present

## 2022-08-13 DIAGNOSIS — F4312 Post-traumatic stress disorder, chronic: Secondary | ICD-10-CM | POA: Diagnosis not present

## 2022-08-31 DIAGNOSIS — F4312 Post-traumatic stress disorder, chronic: Secondary | ICD-10-CM | POA: Diagnosis not present

## 2022-09-01 DIAGNOSIS — F4312 Post-traumatic stress disorder, chronic: Secondary | ICD-10-CM | POA: Diagnosis not present

## 2022-09-10 DIAGNOSIS — F4312 Post-traumatic stress disorder, chronic: Secondary | ICD-10-CM | POA: Diagnosis not present

## 2022-10-08 DIAGNOSIS — F4312 Post-traumatic stress disorder, chronic: Secondary | ICD-10-CM | POA: Diagnosis not present

## 2022-11-05 DIAGNOSIS — F4312 Post-traumatic stress disorder, chronic: Secondary | ICD-10-CM | POA: Diagnosis not present

## 2022-12-03 DIAGNOSIS — F4312 Post-traumatic stress disorder, chronic: Secondary | ICD-10-CM | POA: Diagnosis not present

## 2023-03-03 DIAGNOSIS — F4312 Post-traumatic stress disorder, chronic: Secondary | ICD-10-CM | POA: Diagnosis not present

## 2023-03-17 DIAGNOSIS — F4312 Post-traumatic stress disorder, chronic: Secondary | ICD-10-CM | POA: Diagnosis not present

## 2023-03-31 DIAGNOSIS — F4312 Post-traumatic stress disorder, chronic: Secondary | ICD-10-CM | POA: Diagnosis not present

## 2023-04-28 DIAGNOSIS — F4312 Post-traumatic stress disorder, chronic: Secondary | ICD-10-CM | POA: Diagnosis not present

## 2023-05-04 DIAGNOSIS — F4312 Post-traumatic stress disorder, chronic: Secondary | ICD-10-CM | POA: Diagnosis not present

## 2023-05-17 DIAGNOSIS — F4312 Post-traumatic stress disorder, chronic: Secondary | ICD-10-CM | POA: Diagnosis not present

## 2023-05-26 DIAGNOSIS — F4312 Post-traumatic stress disorder, chronic: Secondary | ICD-10-CM | POA: Diagnosis not present

## 2023-06-17 DIAGNOSIS — F4312 Post-traumatic stress disorder, chronic: Secondary | ICD-10-CM | POA: Diagnosis not present

## 2023-07-01 DIAGNOSIS — F4312 Post-traumatic stress disorder, chronic: Secondary | ICD-10-CM | POA: Diagnosis not present

## 2023-07-14 DIAGNOSIS — F4312 Post-traumatic stress disorder, chronic: Secondary | ICD-10-CM | POA: Diagnosis not present

## 2023-07-21 DIAGNOSIS — F4312 Post-traumatic stress disorder, chronic: Secondary | ICD-10-CM | POA: Diagnosis not present

## 2023-08-11 DIAGNOSIS — F4312 Post-traumatic stress disorder, chronic: Secondary | ICD-10-CM | POA: Diagnosis not present

## 2023-08-18 DIAGNOSIS — F4312 Post-traumatic stress disorder, chronic: Secondary | ICD-10-CM | POA: Diagnosis not present

## 2023-08-24 DIAGNOSIS — F4312 Post-traumatic stress disorder, chronic: Secondary | ICD-10-CM | POA: Diagnosis not present

## 2023-09-03 DIAGNOSIS — F4312 Post-traumatic stress disorder, chronic: Secondary | ICD-10-CM | POA: Diagnosis not present

## 2023-09-15 DIAGNOSIS — F4312 Post-traumatic stress disorder, chronic: Secondary | ICD-10-CM | POA: Diagnosis not present

## 2023-10-04 DIAGNOSIS — Z6838 Body mass index (BMI) 38.0-38.9, adult: Secondary | ICD-10-CM | POA: Diagnosis not present

## 2023-10-04 DIAGNOSIS — M109 Gout, unspecified: Secondary | ICD-10-CM | POA: Diagnosis not present

## 2023-10-04 DIAGNOSIS — E785 Hyperlipidemia, unspecified: Secondary | ICD-10-CM | POA: Diagnosis not present

## 2023-10-29 DIAGNOSIS — F4312 Post-traumatic stress disorder, chronic: Secondary | ICD-10-CM | POA: Diagnosis not present

## 2023-11-12 DIAGNOSIS — F4312 Post-traumatic stress disorder, chronic: Secondary | ICD-10-CM | POA: Diagnosis not present

## 2023-11-18 DIAGNOSIS — F4312 Post-traumatic stress disorder, chronic: Secondary | ICD-10-CM | POA: Diagnosis not present

## 2023-12-17 DIAGNOSIS — F4312 Post-traumatic stress disorder, chronic: Secondary | ICD-10-CM | POA: Diagnosis not present

## 2023-12-23 DIAGNOSIS — F4312 Post-traumatic stress disorder, chronic: Secondary | ICD-10-CM | POA: Diagnosis not present

## 2023-12-29 DIAGNOSIS — F4312 Post-traumatic stress disorder, chronic: Secondary | ICD-10-CM | POA: Diagnosis not present

## 2024-01-05 DIAGNOSIS — F4312 Post-traumatic stress disorder, chronic: Secondary | ICD-10-CM | POA: Diagnosis not present

## 2024-01-10 DIAGNOSIS — R070 Pain in throat: Secondary | ICD-10-CM | POA: Diagnosis not present

## 2024-01-10 DIAGNOSIS — R051 Acute cough: Secondary | ICD-10-CM | POA: Diagnosis not present

## 2024-01-10 DIAGNOSIS — R0981 Nasal congestion: Secondary | ICD-10-CM | POA: Diagnosis not present

## 2024-01-10 DIAGNOSIS — R509 Fever, unspecified: Secondary | ICD-10-CM | POA: Diagnosis not present

## 2024-02-02 DIAGNOSIS — F4312 Post-traumatic stress disorder, chronic: Secondary | ICD-10-CM | POA: Diagnosis not present

## 2024-02-09 DIAGNOSIS — F4312 Post-traumatic stress disorder, chronic: Secondary | ICD-10-CM | POA: Diagnosis not present

## 2024-02-23 DIAGNOSIS — F4312 Post-traumatic stress disorder, chronic: Secondary | ICD-10-CM | POA: Diagnosis not present

## 2024-03-01 DIAGNOSIS — F4312 Post-traumatic stress disorder, chronic: Secondary | ICD-10-CM | POA: Diagnosis not present

## 2024-03-16 DIAGNOSIS — F4312 Post-traumatic stress disorder, chronic: Secondary | ICD-10-CM | POA: Diagnosis not present

## 2024-03-28 DIAGNOSIS — F4312 Post-traumatic stress disorder, chronic: Secondary | ICD-10-CM | POA: Diagnosis not present

## 2024-04-05 DIAGNOSIS — F4312 Post-traumatic stress disorder, chronic: Secondary | ICD-10-CM | POA: Diagnosis not present

## 2024-04-25 DIAGNOSIS — M546 Pain in thoracic spine: Secondary | ICD-10-CM | POA: Diagnosis not present

## 2024-04-25 DIAGNOSIS — M5431 Sciatica, right side: Secondary | ICD-10-CM | POA: Diagnosis not present

## 2024-05-03 DIAGNOSIS — M546 Pain in thoracic spine: Secondary | ICD-10-CM | POA: Diagnosis not present

## 2024-05-03 DIAGNOSIS — M5431 Sciatica, right side: Secondary | ICD-10-CM | POA: Diagnosis not present

## 2024-05-16 DIAGNOSIS — F4312 Post-traumatic stress disorder, chronic: Secondary | ICD-10-CM | POA: Diagnosis not present
